# Patient Record
Sex: Female | Born: 1960 | Race: White | Hispanic: No | Marital: Married | Smoking: Never smoker
Health system: Southern US, Community
[De-identification: ages and names within clinical notes are randomized; demographics above are authoritative.]

## PROBLEM LIST (undated history)

## (undated) DIAGNOSIS — I10 Essential (primary) hypertension: Secondary | ICD-10-CM

## (undated) DIAGNOSIS — N871 Moderate cervical dysplasia: Secondary | ICD-10-CM

## (undated) DIAGNOSIS — E7800 Pure hypercholesterolemia, unspecified: Secondary | ICD-10-CM

## (undated) DIAGNOSIS — M810 Age-related osteoporosis without current pathological fracture: Secondary | ICD-10-CM

## (undated) DIAGNOSIS — N890 Mild vaginal dysplasia: Secondary | ICD-10-CM

## (undated) DIAGNOSIS — A64 Unspecified sexually transmitted disease: Secondary | ICD-10-CM

## (undated) HISTORY — DX: Pure hypercholesterolemia, unspecified: E78.00

## (undated) HISTORY — DX: Moderate cervical dysplasia: N87.1

## (undated) HISTORY — DX: Essential (primary) hypertension: I10

## (undated) HISTORY — DX: Age-related osteoporosis without current pathological fracture: M81.0

## (undated) HISTORY — DX: Unspecified sexually transmitted disease: A64

## (undated) HISTORY — PX: OTHER SURGICAL HISTORY: SHX169

## (undated) HISTORY — DX: Mild vaginal dysplasia: N89.0

---

## 2000-01-20 ENCOUNTER — Encounter: Admission: RE | Admit: 2000-01-20 | Discharge: 2000-01-20 | Payer: Self-pay | Admitting: *Deleted

## 2000-01-20 ENCOUNTER — Encounter: Payer: Self-pay | Admitting: *Deleted

## 2001-02-10 ENCOUNTER — Encounter: Payer: Self-pay | Admitting: *Deleted

## 2001-02-10 ENCOUNTER — Encounter: Admission: RE | Admit: 2001-02-10 | Discharge: 2001-02-10 | Payer: Self-pay | Admitting: *Deleted

## 2001-03-03 ENCOUNTER — Other Ambulatory Visit: Admission: RE | Admit: 2001-03-03 | Discharge: 2001-03-03 | Payer: Self-pay | Admitting: *Deleted

## 2001-06-17 ENCOUNTER — Encounter: Payer: Self-pay | Admitting: *Deleted

## 2001-06-17 ENCOUNTER — Encounter: Admission: RE | Admit: 2001-06-17 | Discharge: 2001-06-17 | Payer: Self-pay | Admitting: *Deleted

## 2002-02-17 ENCOUNTER — Encounter: Admission: RE | Admit: 2002-02-17 | Discharge: 2002-02-17 | Payer: Self-pay | Admitting: *Deleted

## 2002-02-17 ENCOUNTER — Encounter: Payer: Self-pay | Admitting: *Deleted

## 2002-03-21 ENCOUNTER — Other Ambulatory Visit: Admission: RE | Admit: 2002-03-21 | Discharge: 2002-03-21 | Payer: Self-pay | Admitting: *Deleted

## 2004-06-04 ENCOUNTER — Other Ambulatory Visit: Admission: RE | Admit: 2004-06-04 | Discharge: 2004-06-04 | Payer: Self-pay | Admitting: Obstetrics and Gynecology

## 2004-06-06 ENCOUNTER — Encounter: Admission: RE | Admit: 2004-06-06 | Discharge: 2004-06-06 | Payer: Self-pay | Admitting: Obstetrics and Gynecology

## 2004-07-13 DIAGNOSIS — N871 Moderate cervical dysplasia: Secondary | ICD-10-CM

## 2004-07-13 HISTORY — DX: Moderate cervical dysplasia: N87.1

## 2004-08-02 HISTORY — PX: CERVICAL BIOPSY  W/ LOOP ELECTRODE EXCISION: SUR135

## 2004-11-29 ENCOUNTER — Other Ambulatory Visit: Admission: RE | Admit: 2004-11-29 | Discharge: 2004-11-29 | Payer: Self-pay | Admitting: Obstetrics and Gynecology

## 2005-03-07 ENCOUNTER — Other Ambulatory Visit: Admission: RE | Admit: 2005-03-07 | Discharge: 2005-03-07 | Payer: Self-pay | Admitting: Obstetrics and Gynecology

## 2005-08-15 ENCOUNTER — Other Ambulatory Visit: Admission: RE | Admit: 2005-08-15 | Discharge: 2005-08-15 | Payer: Self-pay | Admitting: Obstetrics and Gynecology

## 2005-09-12 ENCOUNTER — Encounter: Admission: RE | Admit: 2005-09-12 | Discharge: 2005-09-12 | Payer: Self-pay | Admitting: Obstetrics and Gynecology

## 2006-12-07 ENCOUNTER — Encounter: Admission: RE | Admit: 2006-12-07 | Discharge: 2006-12-07 | Payer: Self-pay | Admitting: Obstetrics and Gynecology

## 2006-12-07 ENCOUNTER — Other Ambulatory Visit: Admission: RE | Admit: 2006-12-07 | Discharge: 2006-12-07 | Payer: Self-pay | Admitting: Obstetrics and Gynecology

## 2007-12-02 ENCOUNTER — Encounter: Admission: RE | Admit: 2007-12-02 | Discharge: 2007-12-02 | Payer: Self-pay | Admitting: Obstetrics and Gynecology

## 2007-12-29 ENCOUNTER — Encounter: Admission: RE | Admit: 2007-12-29 | Discharge: 2007-12-29 | Payer: Self-pay | Admitting: Obstetrics and Gynecology

## 2008-04-05 ENCOUNTER — Other Ambulatory Visit: Admission: RE | Admit: 2008-04-05 | Discharge: 2008-04-05 | Payer: Self-pay | Admitting: Obstetrics and Gynecology

## 2008-05-23 ENCOUNTER — Encounter: Payer: Self-pay | Admitting: Obstetrics and Gynecology

## 2008-05-23 ENCOUNTER — Ambulatory Visit (HOSPITAL_COMMUNITY): Admission: RE | Admit: 2008-05-23 | Discharge: 2008-05-24 | Payer: Self-pay | Admitting: Obstetrics and Gynecology

## 2008-05-23 HISTORY — PX: ABDOMINAL HYSTERECTOMY: SHX81

## 2008-07-15 ENCOUNTER — Ambulatory Visit (HOSPITAL_COMMUNITY): Admission: EM | Admit: 2008-07-15 | Discharge: 2008-07-16 | Payer: Self-pay | Admitting: Emergency Medicine

## 2008-07-15 ENCOUNTER — Encounter (INDEPENDENT_AMBULATORY_CARE_PROVIDER_SITE_OTHER): Payer: Self-pay | Admitting: Surgery

## 2008-07-15 HISTORY — PX: APPENDECTOMY: SHX54

## 2009-01-17 ENCOUNTER — Encounter: Admission: RE | Admit: 2009-01-17 | Discharge: 2009-01-17 | Payer: Self-pay | Admitting: Obstetrics and Gynecology

## 2009-06-28 ENCOUNTER — Encounter: Admission: RE | Admit: 2009-06-28 | Discharge: 2009-06-28 | Payer: Self-pay | Admitting: Family Medicine

## 2009-10-13 DIAGNOSIS — N890 Mild vaginal dysplasia: Secondary | ICD-10-CM

## 2009-10-13 HISTORY — DX: Mild vaginal dysplasia: N89.0

## 2010-04-03 ENCOUNTER — Encounter: Admission: RE | Admit: 2010-04-03 | Discharge: 2010-04-03 | Payer: Self-pay | Admitting: Obstetrics and Gynecology

## 2011-02-25 NOTE — Op Note (Signed)
Monique Brown, Monique Brown               ACCOUNT NO.:  [id number]   MEDICAL RECORD NO.:  [id number]          PATIENT TYPE:  OIB   LOCATION:  9316                          FACILITY:  [facility]   PHYSICIAN:  Cynthia P. Romine, M.D.DATE OF BIRTH:  1960/12/20   DATE OF PROCEDURE:  05/23/2008  DATE OF DISCHARGE:  05/24/2008                               OPERATIVE REPORT   PREOPERATIVE DIAGNOSES:  1. Uterovaginal prolapse.  2. Cystocele.   POSTOPERATIVE DIAGNOSES:  1. Uterovaginal prolapse.  2. Cystocele.   PATHOLOGY:  Pending.   PROCEDURES:  1. Total vaginal hysterectomy by Dr. Purvis Sheffield.  2. Cystocele repair with graft and cystoscopy by Dr. Alfredo Martinez.   ASSISTANT FOR THE HYSTERECTOMY:  Dr. Lorin Picket MacDiarmid   Dr. Sherron Monday will dictate his part of the surgery in a separate  report.   ANESTHESIA:  General endotracheal.   ESTIMATED BLOOD LOSS:  250 mL.   There were no complications.   PROCEDURE:  The patient was taken to the operating room and after  induction of adequate general anesthesia was placed in dorsal lithotomy  position and prepped and draped in the usual fashion.  The bladder was  drained with a red rubber catheter.  There was a grade 3 cystocele.  A  posterior weighted and anterior Sims retractor were placed and the  anterior lip was grabbed with a Jacobs tenaculum.  Vaginal mucosa over  the cervix was injected with a dilute solution of Xylocaine with  epinephrine.  The mucosa was incised with a knife from 9 o'clock to 3  o'clock and pushed forward with sharp and blunt dissection.  The  tenaculums was then used to grasp the posterior lip.  A posterior  colpotomy incision was made and a long Veenema retractor was placed.  The uterosacral ligaments were clamped, cut, and tied bilaterally with 0  Vicryl.  The hysterectomy continued up the cardinal ligament clamping,  cutting, and tying in sequence.  The uterine arteries were identified,  clamped, cut, and doubly  tied.  The hysterectomy continued up the  clamping, cutting, and tying in sequence as the pedicle containing the  tube, utero-ovarian ligament, and round ligament that pedicle was  clamped, cut, and doubly tied.  The specimen was removed and sent to  pathology.  The pedicles were inspected and found to be free of  bleeding.  The vaginal cuff was run in a  running locking suture from 2 o'clock to 10 o'clock using 0 Vicryl.  The  wound was irrigated and was found to be hemostatic.  The vaginal cuff  was not closed in preparation for Dr. Sherron Monday to do his cystocele  repair and he will dictate that surgery separately.      Cynthia P. Romine, M.D.  Electronically Signed     CPR/MEDQ  D:  06/12/2008  T:  06/13/2008  Job:  [id number]   cc:   Martina Sinner, MD  Fax: [phone]

## 2011-02-25 NOTE — Op Note (Signed)
NAMENIANI, MOURER NO.:  [id number]   MEDICAL RECORD NO.:  [id number]          PATIENT TYPE:  OIB   LOCATION:  9316                          FACILITY:  [facility]   PHYSICIAN:  Martina Sinner, MD DATE OF BIRTH:  Jun 10, 1961   DATE OF PROCEDURE:  05/23/2008  DATE OF DISCHARGE:                               OPERATIVE REPORT   PREOPERATIVE DIAGNOSES:  Uterovaginal prolapse plus cystocele.   POSTOPERATIVE DIAGNOSES:  Uterovaginal prolapse plus cystocele.   SURGERY:  Cystocele repair plus graft plus cystoscopy.   SURGEON:  Martina Sinner, MD   ASSISTANT:  Edwena Felty. Romine, MD   Transvaginal hysterectomy   SURGEON:  Cynthia P. Romine, MD   ASSISTANT:  Lum Keas, MD   Ms. Darrel Hoover had pelvic organ prolapse with a small grade 3  cystocele,loss of cervical support,and a very mild asymptomatic  rectocele.  She consented to the above surgery.  The patient was prepped  and draped in usual fashion.  Preoperative laboratory tests were normal.  Extra care was taken with leg positioning to minimize the risk of  compartment syndrome, neuropathy, and DVT.  Transvaginal hysterectomy  was performed by Dr. Tresa Res and I was asked to join following this.   Following the hysterectomy, the vaginal cuff had been one posteriorly  and a McCall procedure was done to plicate the uterosacral ligaments.  This maneuver lessened the rectocele and significantly less than the  size of the cystocele.  There is no question, the vaginal cuff was  actually reasonably well supported with minimal descensus and she did  not need a sacrospinous fixation, but I elected to four-corner dermal  graft and cystocele repair.   I placed 2 Allis clamps anteriorly at the open peritoneal cavity.  I  made a long anterior incision after instilling 20 mL of lidocaine  epinephrine mixture for hemostasis and for dissection.  I sharply split  in the midline all the way to the proximal urethra.   I sharply dissected  laterally and then finger dissected off almost to the white line  bilaterally.  I appropriately mobilized cephalad at the cuff opening.   Without plicating the urethra, I did a two-layer anterior repair with 2-  0 Vicryl.  I cystoscoped the patient after giving indigo carmine.  There  was a typical camel hump deformity in the midline and ureteral orifices  were both situated laterally and effluxing good jets of blue dye.  There  was no injury to the urethra and bladder.   I used my narrow malleable in usual technique to make certain that I was  beyond the peritoneal wall reflection and all the way to the white line.  I placed 4-0 Vicryls on a CT-2 needle through the ileal coccygeus  muscle.  The 2 cephalad were in a cephalad direction along the white  line towards, but not all the way to the ischial spine.  The other 2  were close to the urethrovesical angle.  They were firmly placed.   I soaked 10, 7 x 4 dermal graft was tailored to fit appropriately.  It  was sutured in place in a four-corner fashion.  It laid nicely not under  tension and there was redundant dermis at the level of the apex.  I  trimmed some in the midline not to support the urethra.   I trimmed minimally vaginal wall mucosa approximately 1 cm on the right  and a few millimeters on the left.  I did a running anterior vaginal  wall closure with running 2-0 Vicryl on a CT-1 needle.  Dr. Tresa Res then  closed the vaginal cuff.   Both Dr. Tresa Res and I were very happy with the vaginal length and  support.  Vaginal pack with Estrace cream was inserted.  Estimated blood  loss was approximately 250 mL.  Leg position was good at the end of the  case.  Catheter was draining blue urine at the end of the case.  The  patient was taken to recovery room.  I am hoping this operation will  reach Ms. Robson's treatment goal.  There is no question she did not  need a rectocele repair performed  simultaneously.           ______________________________  Martina Sinner, MD  Electronically Signed     SAM/MEDQ  D:  05/23/2008  T:  05/24/2008  Job:  [id number]

## 2011-02-25 NOTE — H&P (Signed)
[name], [name] NO.:  [id number]   MEDICAL RECORD NO.:  [id number]          PATIENT TYPE:  INP   LOCATION:  1318                         FACILITY:  [facility]   PHYSICIAN:  Ardeth Sportsman, MD     DATE OF BIRTH:  [birth year]   DATE OF ADMISSION:  07/15/2008  DATE OF DISCHARGE:  07/16/2008                              HISTORY & PHYSICAL   PRIMARY CARE PHYSICIAN:  Cynthia P. Romine, M.D.   UROLOGIST:  Alfredo Martinez, M.D.   REASON FOR CONSULT AND ADMISSION:  Probable acute appendicitis.   HISTORY OF PRESENT ILLNESS:  Monique Brown is a 50 year old female  otherwise very healthy who is about almost 2 months out from repair of a  cystocele and a hysterectomy for prolapsed uterus.  She had been  recovering well from that and was actually planning to get married next  week, in here today with her fiance.   She has had worsening abdominal pain over the past week that is focused  in her right lower quadrant.  It was felt initially to be a cyst, as it  did not seem to be too intense.  However, after having a heavy meal  tonight, the pain intensified and became very severe in the right lower  quadrant.  She required numerous rounds of IV medication to help get it  under control.  She has had some anorexia, but no severe nausea or  vomiting.  She has never had anything like this before this past week.  No hematochezia or melena.  No sick contacts or travel history.  No  dysphagia.  No heartburn or reflux.  She normally has excellent physical  activity.   Workup was concerning for appendicitis and surgical consult was  requested.   PAST MEDICAL HISTORY:  1. Ureterovaginal prolapse, status post hysterectomy.  2. Cystocele status post repair and cystoscopy by Dr. Sherron Monday.   PAST SURGICAL HISTORY:  1. Transvaginal history by Dr. Meredeth Ide.  2. Cystocele status post graft cholecystectomy by Dr. Sherron Monday.      These were on May 23, 2008.   ALLERGIES:   None.   MEDICATIONS:  None.   SOCIAL HISTORY:  She occasionally drinks wine, but no other alcohol or  drug use.  She is here with her fiance.   FAMILY HISTORY:  Negative for any cardiopulmonary disease.  She does  have 2 brothers with type 1 diabetes.  No history of inflammatory bowel  disorders or GI problems, or hepatobiliary, other issues, or Crohn's  disease that she is aware of.   REVIEW OF SYSTEMS:  As noted in the chart.  CONSTITUTIONAL:  No major  fevers, chills, sweats.  She does have some fatigue, not severe.  Eyes,  ENT, cardiac, respiratory, heme, lymph, allergic, otherwise negative.  Musculoskeletal, psych, neurologic, otherwise negative.  GI:  As noted  above.  GYN:  Recent hysterectomy, but no vaginal bleeding or discharge.  GU:  She does have a little bit of pressure when she tries to urinate,  but I would not say frequency or urgency.  No dysuria, pyuria, or  hematuria.  No recent urinary tract infection that she can recall.  Hepatic, renal, endocrine otherwise negative.   PHYSICAL EXAM:  VITAL SIGNS:  Temperature 98.4, blood pressure 155/97,  pulse 89, respirations 20.  Her initial pain was 10/10 and went down to  7/10 with pain meds, currently it is around 3/10 to 4/10 after receiving  IV narcotics.  She has 97% sats on room air.  GENERAL:  She is a well-developed, well-nourished thin female lying in  bed, very still, not particularly toxic.  PSYCH:  She is pleasant, interactive, with average to above average  intelligence.  No evidence of any dementia, delirium, psychosis, or  paranoia.  NEUROLOGIC:  Cranial nerves 2-12 are intact.  Fingers 5/5, strength  symmetrical.  No resting or attention tremors.  EYES:  Pupils equal, round, and reactive to light.  Extraocular  movements intact.  NECK:  Supple.  No mass.  Trachea is midline.  ENT:  She is normocephalic.  Mucous membranes are dry.  Oropharynx  clear.  HEART:  Regular rate and rhythm.  No murmurs, rubs, or  gallops.  CHEST:  Clear to auscultation bilaterally.  No wheeze, rales, or  rhonchi.  No pain to rib compression.  LYMPHATICS:  No head, neck, axillary, or groin lymphadenopathy.  ABDOMEN:  Soft and flat.  She does have pain right over McBurney's point  that is reproduced with a cough and bed shake, although it is not too  intense.  She did receive narcotics in the ED.  GENITOURINARY:  Normal external female genitalia.  RECTAL:  Deferred.  EXTREMITIES:  No cyanosis, clubbing, or edema.  MUSCULOSKELETAL:  Full range of motion of the shoulders, elbows, wrists,  as well as hips, knees, and ankles.  SKIN:  No petechia.  No purpura.  No other obvious sores or lesions.   LABORATORY VALUES:  Her white count is 8.4 with a borderline left shift.  Urinalysis has trace leukocyte esterase, otherwise negative.  Her  electrolytes are within the normal range.  She has a CAT scan which  shows no evidence of any free air or bowel obstruction, or significant  free fluid.  She does have blind filled loop and enlarged appendix going  down her pelvis.   ASSESSMENT AND PLAN:  A 50 year old female with story concerning for  acute appendicitis and seems to be confirmed by physical exam and CAT  scan.   The anatomy and physiology of the digestive tract was discussed.  Differential diagnosis was discussed.  Options discussed and  recommendation was made for diagnostic laparoscopy with appendectomy.  The risks, benefits, and alternatives were discussed.  Questions  answered and she agrees to proceed.  Will work to coordinate this at a  continent time.      Ardeth Sportsman, MD  Electronically Signed     SCG/MEDQ  D:  07/15/2008  T:  07/16/2008  Job:  [id number]

## 2011-02-25 NOTE — Op Note (Signed)
[name], [name] NO.:  [id number]   MEDICAL RECORD NO.:  [id number]          PATIENT TYPE:  INP   LOCATION:  1318                         FACILITY:  [facility]   PHYSICIAN:  Monique Sportsman, MD     DATE OF BIRTH:  May 27, 1961   DATE OF PROCEDURE:  DATE OF DISCHARGE:                               OPERATIVE REPORT   PRIMARY CARE PHYSICIAN:  Monique Brown, M.D. with OB-GYN.   UROLOGIST:  Monique Sinner, MD   SURGEON:  Monique Sportsman, MD   ASSISTANT:  None.   PREOPERATIVE DIAGNOSIS:  Acute appendicitis.   POSTOPERATIVE DIAGNOSIS:  1. Acute appendicitis, suppurative - probable early perforated.  2. Question of small right direct inguinal hernia.   PROCEDURE PERFORMED:  Diagnostic laparoscopy with appendectomy.   ANESTHESIA:  1. General anesthesia.  2. Local anesthetic in a field block around all port sites.   SPECIMEN:  Appendix.   DRAINS:  None.   ESTIMATED BLOOD LOSS:  Less than 10 mL.   COMPLICATIONS:  None apparent.   INDICATIONS:  [name] is a pleasant 50 year old who had a total  vaginal hysterectomy for uterine prolapse and cystocele repair by Dr.  Lorin Picket A Brown back in August 2009.  She has had worsening abdominal  pain for the past week.  It was initially felt to possibly be an ovarian  cyst, however, she had severe pain after eating a heavier meal and came  to the emergency room.  Workup by CAT scan and physical exam was more  concerning for acute appendicitis, and therefore, a surgical  consultation was requested.   The anatomy of physiology of the digestive tract was explained.  Past  physiology of acute appendicitis with its natural history risks were  discussed.  Options discussed and recommendations made for diagnostic  laparoscopy with probable appendectomy.  The risks, benefits and  alternatives were discussed in detail with the patient and her fiance.  Questions answers and she and her future husband agreed to  proceed.   OPERATIVE FINDINGS:  She had a short appendix, but the distal half was  densely adherent to the pelvic sidewall and her right adnexa,  particularly her fallopian tube.  There was evidence of suppurative and  chronic phlegmon, but there was no large abscess.  It was suspicious for  localized perforation.  The rest of her abdomen appeared unremarkable.  She only has some mild adhesions with some sigmoid epiploica appendages  down to her pelvis, but otherwise no significant adhesions.  She may  have a small direct inguinal hernia.   DESCRIPTION OF PROCEDURE:  Informed consent was confirmed.  The patient  was already given cefoxitin in the emergency room, so none was repeated  since it was less than 8-hours since her prior dose.  She had sequential  compression devices throughout the entire case.  She was positioned  supine with the arms tucked.  She underwent general anesthesia without  any difficulty.  A Foley catheter was sterilely placed.  Her abdomen was  prepped and draped in a sterile fashion.   A 5-mm  port was placed infraumbilically using Hassan technique to good  result.  Capnoperitoneum to 15mm provided  good intra-abdominal  insufflation.  Under direct visualization, a 5-mm initial port was  placed in the left flank and 12-mm port was placed suprapubically  directed away from the bladder more cephalad.   Camera inspection revealed findings as noted above.  There was some  moderate omental adhesions to the right lower quadrant, and these were  carefully freed off, mostly with blunt dissection with some focused  ultrasonic dissection.  The small bowel was easily brought out of the  pelvis.  I was able to mobilize the cecum in a lateral medial fashion,  finding the appendix densely adhered to the right pelvic sidewall.  I  was able to carefully free it off the fallopian tubes and pelvic  sidewall using primary blunt dissection, taking care to avoid any  grasping or  injury to the right adnexal structures.  There was some  inflammation, but they were not bathed in pus or feces.   The appendiceal mesentery was able to be isolated and ligated using  ultrasonic dissection until we got to the appendiceal/cecal junction.  The appendix was transected at this location using a laparoscopic  stapler and removed out the subxiphoid port inside a specimen bag.   Copious irrigation was done over 4 L of saline to remove any small  phlegmon and peeled.  Irrigation was done out in the pelvis and the  right liver.  There was some epiploica appendage attachments that would  have created some opening for internal herniation and bowel obstruction,  so I freed those off carefully.  I did not go near the bladder or mess  with the bladder.   Interestingly, camera inspection, it looked like she may have a small  right direct inguinal defect, although it was a physiological variant.  There was no evidence of any indirect defects.   Capnoperitoneum was evacuated.  Inspection was done and there was no  evidence of any bowel injury.  The staple line was intact with no  bleeding.  Hemostasis was excellent.  There was no evidence of any bowel  injury or damage.  Capnoperitoneum was evacuated.  Ports were removed.  The suprapubic fascial defect was closed using a 0 Vicryl  interrupted stitch x2.  The skin was closed using 4-0 Monocryl stitch.  Sterile dressings applied.  The patient was extubated and sent to the  recovery room in stable condition.   I discussed postoperative care with the patient and her fiance just  prior to surgery and will discuss after surgery.      Monique Sportsman, MD  Electronically Signed     SCG/MEDQ  D:  07/15/2008  T:  07/15/2008  Job:  [id number]   cc:   Monique Brown, M.D.  Fax: [phone]   Monique Brown, M.D.

## 2011-02-28 NOTE — Discharge Summary (Signed)
Monique Brown, Monique Brown               ACCOUNT NO.:  [id number]   MEDICAL RECORD NO.:  [id number]          PATIENT TYPE:  OIB   LOCATION:  9316                          FACILITY:  [facility]   PHYSICIAN:  Cynthia P. Romine, M.D.DATE OF BIRTH:  10-26-1960   DATE OF ADMISSION:  05/23/2008  DATE OF DISCHARGE:  05/24/2008                               DISCHARGE SUMMARY   DISCHARGE DIAGNOSES:  Pelvic pain and pressure with uterovaginal  prolapse and cystocele.   HOSPITAL COURSE:  This is a 50 year old divorced white female, gravida  3, para 3 with uterovaginal prolapse and cystocele, grade 3.  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  Her uterus was 7 x 6 x 3 cm on ultrasound and had a 4-cm fibroid and the  patient was admitted to undergo total vaginal hysterectomy by me and a  cystocele repair by Dr. Alfredo Martinez.  On May 23, 2008, the  patient did have an uncomplicated total vaginal hysterectomy and  cystocele repair.  She had a cystocele repair with a graft and a  cystoscopy.  Estimated blood loss was 250 mL and there were no  complications.  Postoperatively, the patient had an uneventful course.  By the following morning, she was afebrile, in good condition, and felt  to be in satisfactory condition for discharge.  She was given discharge  instructions, a prescription for Percocet 5 mg #20 to take 1-2 p.o. q.  4h. p.r.n. pain.  The patient was able to void prior to discharge.  She  was given discharge instructions for follow up with me and with Dr.  Sherron Monday.  Labs showed admission H&H with 14 and 40, on discharge 10.9  and 31.6.  Blood type was A positive.  Serum pregnancy test was  negative.  PT and PTT were normal.      Cynthia P. Romine, M.D.  Electronically Signed     CPR/MEDQ  D:  06/05/2008  T:  06/06/2008  Job:  [id number]   cc:   Martina Sinner, MD  Fax: [phone]

## 2011-05-07 ENCOUNTER — Other Ambulatory Visit: Payer: Self-pay | Admitting: Obstetrics and Gynecology

## 2011-05-07 DIAGNOSIS — Z1231 Encounter for screening mammogram for malignant neoplasm of breast: Secondary | ICD-10-CM

## 2011-05-16 ENCOUNTER — Ambulatory Visit
Admission: RE | Admit: 2011-05-16 | Discharge: 2011-05-16 | Disposition: A | Discharge status: Home / Self Care | Payer: 59 | Source: Ambulatory Visit | Attending: Obstetrics and Gynecology | Admitting: Obstetrics and Gynecology

## 2011-05-16 ENCOUNTER — Ambulatory Visit: Payer: Self-pay

## 2011-05-16 DIAGNOSIS — Z1231 Encounter for screening mammogram for malignant neoplasm of breast: Secondary | ICD-10-CM

## 2011-07-11 LAB — CBC
HCT: 40.9
Platelets: 173
Platelets: 238
RBC: 3.31 — ABNORMAL LOW
RBC: 4.34
RDW: 12.4
WBC: 6

## 2011-07-11 LAB — PROTIME-INR: INR: 1

## 2011-07-11 LAB — HCG, SERUM, QUALITATIVE: Preg, Serum: NEGATIVE

## 2011-07-11 LAB — APTT: aPTT: 28

## 2011-07-11 LAB — TYPE AND SCREEN
ABO/RH(D): A POS
Antibody Screen: NEGATIVE

## 2011-07-15 LAB — CBC
Hemoglobin: 14
MCHC: 33.8
MCV: 94.6
Platelets: 245
WBC: 8.4

## 2011-07-15 LAB — URINALYSIS, ROUTINE W REFLEX MICROSCOPIC
Glucose, UA: NEGATIVE
Hgb urine dipstick: NEGATIVE
Ketones, ur: NEGATIVE
Nitrite: NEGATIVE
Protein, ur: NEGATIVE
Urobilinogen, UA: 0.2

## 2011-07-15 LAB — POCT I-STAT, CHEM 8
BUN: 10
Calcium, Ion: 1.24
Chloride: 102
Creatinine, Ser: 1
Potassium: 3.8
Sodium: 139

## 2011-07-15 LAB — DIFFERENTIAL
Basophils Absolute: 0
Basophils Relative: 0
Eosinophils Relative: 1
Lymphs Abs: 0.9
Monocytes Relative: 2 — ABNORMAL LOW
Neutro Abs: 7.3
Neutrophils Relative %: 86 — ABNORMAL HIGH

## 2011-07-15 LAB — URINE MICROSCOPIC-ADD ON

## 2011-09-19 ENCOUNTER — Other Ambulatory Visit: Payer: Self-pay | Admitting: Family Medicine

## 2011-09-19 DIAGNOSIS — Z780 Asymptomatic menopausal state: Secondary | ICD-10-CM

## 2011-10-03 ENCOUNTER — Ambulatory Visit
Admission: RE | Admit: 2011-10-03 | Discharge: 2011-10-03 | Disposition: A | Discharge status: Home / Self Care | Payer: 59 | Source: Ambulatory Visit | Attending: Family Medicine | Admitting: Family Medicine

## 2011-10-03 DIAGNOSIS — Z780 Asymptomatic menopausal state: Secondary | ICD-10-CM

## 2011-10-03 LAB — HM DEXA SCAN

## 2013-01-17 ENCOUNTER — Other Ambulatory Visit: Payer: Self-pay

## 2013-01-17 DIAGNOSIS — Z1231 Encounter for screening mammogram for malignant neoplasm of breast: Secondary | ICD-10-CM

## 2013-02-14 ENCOUNTER — Ambulatory Visit
Admission: RE | Admit: 2013-02-14 | Discharge: 2013-02-14 | Disposition: A | Discharge status: Home / Self Care | Payer: 59 | Source: Ambulatory Visit

## 2013-02-14 DIAGNOSIS — Z1231 Encounter for screening mammogram for malignant neoplasm of breast: Secondary | ICD-10-CM

## 2013-07-29 ENCOUNTER — Ambulatory Visit: Payer: Self-pay | Admitting: Obstetrics and Gynecology

## 2013-08-15 ENCOUNTER — Ambulatory Visit (INDEPENDENT_AMBULATORY_CARE_PROVIDER_SITE_OTHER): Payer: 59 | Admitting: Obstetrics and Gynecology

## 2013-08-15 ENCOUNTER — Encounter: Payer: Self-pay | Admitting: Obstetrics and Gynecology

## 2013-08-15 VITALS — BP 110/64 | HR 56 | Resp 16 | Ht 66.0 in | Wt 132.0 lb

## 2013-08-15 DIAGNOSIS — E559 Vitamin D deficiency, unspecified: Secondary | ICD-10-CM

## 2013-08-15 DIAGNOSIS — Z0000 Encounter for general adult medical examination without abnormal findings: Secondary | ICD-10-CM

## 2013-08-15 DIAGNOSIS — Z01419 Encounter for gynecological examination (general) (routine) without abnormal findings: Secondary | ICD-10-CM

## 2013-08-15 LAB — POCT URINALYSIS DIPSTICK
Bilirubin, UA: NEGATIVE
Blood, UA: NEGATIVE
Glucose, UA: NEGATIVE
Ketones, UA: NEGATIVE
Leukocytes, UA: NEGATIVE
Nitrite, UA: NEGATIVE
Protein, UA: NEGATIVE
Urobilinogen, UA: NEGATIVE
pH, UA: 5

## 2013-08-15 LAB — COMPREHENSIVE METABOLIC PANEL
ALT: 22 U/L (ref 0–35)
AST: 27 U/L (ref 0–37)
Albumin: 4.9 g/dL (ref 3.5–5.2)
Alkaline Phosphatase: 52 U/L (ref 39–117)
CO2: 29 mEq/L (ref 19–32)
Creat: 0.78 mg/dL (ref 0.50–1.10)
Glucose, Bld: 65 mg/dL — ABNORMAL LOW (ref 70–99)
Sodium: 137 mEq/L (ref 135–145)
Total Bilirubin: 0.7 mg/dL (ref 0.3–1.2)
Total Protein: 7.3 g/dL (ref 6.0–8.3)

## 2013-08-15 LAB — CBC
HCT: 42.2 % (ref 36.0–46.0)
Hemoglobin: 14.4 g/dL (ref 12.0–15.0)
MCH: 31.6 pg (ref 26.0–34.0)
MCHC: 34.1 g/dL (ref 30.0–36.0)
MCV: 92.5 fL (ref 78.0–100.0)
Platelets: 280 K/uL (ref 150–400)
RBC: 4.56 MIL/uL (ref 3.87–5.11)
RDW: 12.8 % (ref 11.5–15.5)
WBC: 4.7 K/uL (ref 4.0–10.5)

## 2013-08-15 LAB — LIPID PANEL
Cholesterol: 284 mg/dL — ABNORMAL HIGH (ref 0–200)
HDL: 90 mg/dL
LDL Cholesterol: 173 mg/dL — ABNORMAL HIGH (ref 0–99)
Total CHOL/HDL Ratio: 3.2 ratio
Triglycerides: 103 mg/dL
VLDL: 21 mg/dL (ref 0–40)

## 2013-08-15 NOTE — Patient Instructions (Signed)

## 2013-08-15 NOTE — Addendum Note (Signed)
Addended by: Alphonsa Overall on: 08/15/2013 10:16 AM   Modules accepted: Orders

## 2013-08-15 NOTE — Progress Notes (Signed)
Patient ID: Monique Brown, female   DOB: August 21, 1961, 52 y.o.   MRN: [id number] GYNECOLOGY VISIT  PCP:   Laurann Montana, MD  Referring provider:   HPI: 52 y.o.   Divorced  Caucasian  female   [phone] with No LMP recorded. Patient has had a hysterectomy.   here for  AEX.  No complaints.  No urinary control problems.  Complete review of systems negative.   History of CIN II in 2005.  Hgb:    Urine:  Neg  GYNECOLOGIC HISTORY: No LMP recorded. Patient has had a hysterectomy. Sexually active:  yes Partner preference: female Contraception: hysterectomy for prolapse and fibroid.   Menopausal hormone therapy: no DES exposure:  no  Blood transfusions:   no Sexually transmitted diseases:  HPV  GYN Procedures:  TVH, cystocele reair w/graft and cystoscopy 05/2008, LEEP 2005 Mammogram:    02-14-13 wnl:[facility]             Pap:   07-28-12 wnl History of abnormal pap smear:  Hx CIN II with LEEP 2005   OB History   Grav Para Term Preterm Abortions TAB SAB Ect Mult Living   3 3 3       3        LIFESTYLE: Exercise:     walking          Tobacco:     no Alcohol:        5 glasses of wine per week Drug use:     no  OTHER HEALTH MAINTENANCE: Tetanus/TDap:   Unsure: probably up to date with work Gardisil:  NA Influenza:     never Zostavax:  NA  Bone density:   09-19-11 :[facility]: osteopenia - Dr. Laurann Montana. Colonoscopy:   2010 JYN:[facility].  Next colonoscopy due 2020  Cholesterol check:  Borderline 1 year ago   Family History  Problem Relation Age of Onset  . Hypertension Mother   . Stroke Father   . Hypertension Father   . Diabetes Brother   . Diabetes Brother     There are no active problems to display for this patient.  Past Medical History  Diagnosis Date  . 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 ectocervix margin  . VAIN I (vaginal intraepithelial neoplasia grade I) 2011  . STD (sexually transmitted disease)     HPV     Past Surgical History  Procedure Laterality Date  . Appendectomy  07-15-08  . Abdominal hysterectomy  05-23-08    TVH, cystocele repair w/graft & cystoscopy.  Ovaries remain  . Vain i    . Cervical biopsy  w/ loop electrode excision  08-02-2004    CIN II, Pos. ectocervix margin    ALLERGIES: Review of patient's allergies indicates no known allergies.  Current Outpatient Prescriptions  Medication Sig Dispense Refill  . cholecalciferol (VITAMIN D) 1000 UNITS tablet Take 1,000 Units by mouth as needed.       No current facility-administered medications for this visit.     ROS:  Pertinent items are noted in HPI.  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   PHYSICAL EXAMINATION:    BP 110/64  Pulse 56  Resp 16  Ht 5\' 6"  (1.676 m)  Wt 132 lb (59.875 kg)  BMI 21.32 kg/m2   Wt Readings from Last 3 Encounters:  08/15/13 132 lb (59.875 kg)     Ht Readings from Last 3 Encounters:  08/15/13 5\' 6"  (  1.676 m)    General appearance: alert, cooperative and appears stated age Head: Normocephalic, without obvious abnormality, atraumatic Neck: no adenopathy, supple, symmetrical, trachea midline and thyroid not enlarged, symmetric, no tenderness/mass/nodules Lungs: clear to auscultation bilaterally Breasts: Inspection negative, No nipple retraction or dimpling, No nipple discharge or bleeding, No axillary or supraclavicular adenopathy, Normal to palpation without dominant masses Heart: regular rate and rhythm Abdomen: soft, non-tender; no masses,  no organomegaly Extremities: extremities normal, atraumatic, no cyanosis or edema Skin: Skin color, texture, turgor normal. No rashes or lesions Lymph nodes: Cervical, supraclavicular, and axillary nodes normal. No abnormal inguinal nodes palpated Neurologic: Grossly normal  Pelvic: External genitalia:  no lesions              Urethra:  normal appearing urethra with no masses, tenderness or lesions               Bartholins and Skenes: normal                 Vagina: normal appearing vagina with normal color and discharge, no lesions, graft palpable anteriorly. Minimal high cystocele.              Cervix:  Absent.              Pap and high risk HPV testing done: yes.            Bimanual Exam:  Uterus:   Absent.                                       Adnexa: normal adnexa in size, nontender and no masses                                      Rectovaginal: Confirms                                      Anus:  normal sphincter tone, no lesions  ASSESSMENT  Normal gynecologic exam. Status post hysterectomy and cystocele prolapse repair.  History of CIN II.  Ostoepenia.   PLAN  Mammogram yearly Pap smear and high risk HPV testing yearly until at least 2025.  Counseled on  Osteoporosis prevention. Bone density next year.  Dr. Cliffton Asters ordered last bone density. FLP, CBC, CMP, Vit D.  Encouraged flu vaccine.  Return annually or prn   An After Visit Summary was printed and given to the patient.

## 2013-08-16 LAB — VITAMIN D 25 HYDROXY (VIT D DEFICIENCY, FRACTURES): Vit D, 25-Hydroxy: 45 ng/mL (ref 30–89)

## 2014-06-08 ENCOUNTER — Encounter: Payer: Self-pay | Admitting: Obstetrics and Gynecology

## 2014-08-14 ENCOUNTER — Encounter: Payer: Self-pay | Admitting: Obstetrics and Gynecology

## 2014-08-21 ENCOUNTER — Encounter: Payer: Self-pay | Admitting: Obstetrics and Gynecology

## 2014-08-21 ENCOUNTER — Ambulatory Visit (INDEPENDENT_AMBULATORY_CARE_PROVIDER_SITE_OTHER): Payer: 59 | Admitting: Obstetrics and Gynecology

## 2014-08-21 ENCOUNTER — Ambulatory Visit: Payer: 59 | Admitting: Obstetrics and Gynecology

## 2014-08-21 VITALS — BP 110/68 | HR 60 | Resp 14 | Ht 65.5 in | Wt 133.0 lb

## 2014-08-21 DIAGNOSIS — Z01419 Encounter for gynecological examination (general) (routine) without abnormal findings: Secondary | ICD-10-CM

## 2014-08-21 DIAGNOSIS — Z0000 Encounter for general adult medical examination without abnormal findings: Secondary | ICD-10-CM

## 2014-08-21 LAB — COMPREHENSIVE METABOLIC PANEL
ALK PHOS: 52 U/L (ref 39–117)
ALT: 17 U/L (ref 0–35)
AST: 23 U/L (ref 0–37)
Albumin: 4.5 g/dL (ref 3.5–5.2)
BILIRUBIN TOTAL: 0.5 mg/dL (ref 0.2–1.2)
BUN: 9 mg/dL (ref 6–23)
CO2: 26 mEq/L (ref 19–32)
CREATININE: 0.75 mg/dL (ref 0.50–1.10)
Calcium: 9.7 mg/dL (ref 8.4–10.5)
Chloride: 100 mEq/L (ref 96–112)
Glucose, Bld: 82 mg/dL (ref 70–99)
Potassium: 4 mEq/L (ref 3.5–5.3)
Sodium: 139 mEq/L (ref 135–145)
Total Protein: 6.8 g/dL (ref 6.0–8.3)

## 2014-08-21 LAB — LIPID PANEL
CHOL/HDL RATIO: 2.6 ratio
CHOLESTEROL: 257 mg/dL — AB (ref 0–200)
HDL: 98 mg/dL (ref >39)
LDL CALC: 136 mg/dL — AB (ref 0–99)
TRIGLYCERIDES: 117 mg/dL (ref <150)
VLDL: 23 mg/dL (ref 0–40)

## 2014-08-21 LAB — POCT URINALYSIS DIPSTICK
BILIRUBIN UA: NEGATIVE
Glucose, UA: NEGATIVE
Ketones, UA: NEGATIVE
Leukocytes, UA: NEGATIVE
NITRITE UA: NEGATIVE
PH UA: 5
Protein, UA: NEGATIVE
RBC UA: NEGATIVE
Urobilinogen, UA: NEGATIVE

## 2014-08-21 LAB — CBC
HEMATOCRIT: 39.5 % (ref 36.0–46.0)
Hemoglobin: 13.8 g/dL (ref 12.0–15.0)
MCH: 31.8 pg (ref 26.0–34.0)
MCHC: 34.9 g/dL (ref 30.0–36.0)
MCV: 91 fL (ref 78.0–100.0)
Platelets: 277 10*3/uL (ref 150–400)
RBC: 4.34 MIL/uL (ref 3.87–5.11)
RDW: 12.8 % (ref 11.5–15.5)
WBC: 3.9 10*3/uL — ABNORMAL LOW (ref 4.0–10.5)

## 2014-08-21 NOTE — Progress Notes (Signed)
Patient ID: Monique Brown, female   DOB: 10-25-1960, 53 y.o.   MRN: [id number] 53 y.o. [id number] MarriedCaucasianF here for annual exam.   Patient is Charity fundraiserN.  Husband works for [employer]. Daughter had a baby.  Down sized their home.  Bought a home and renovated it.   PCP:  Monique Montanaynthia White, MD   No LMP recorded. Patient has had a hysterectomy.          Sexually active: Yes.   female The current method of family planning is status post hysterectomy.--still has ovaries.   Had cystocele repair and graft replacement.   Exercising: Yes.    walking. Smoker:  no  Health Maintenance: Pap:  08-15-13 wnl:neg HR HPV History of abnormal Pap:  Yes,Hx CIN II with LEEP procedure 2005 MMG:  02-15-13 heterogeneously dense/nl:[facility] Colonoscopy:  2010 [facility].  Next colonoscopy due 2020. BMD:   10-03-11: Dr. Cliffton AstersWhite.  TDaP:  Up to date through work Screening Labs - Hb today: 13.8, Urine today: Neg   reports that she has never smoked. She does not have any smokeless tobacco history on file. She reports that she drinks about 4.2 oz of alcohol per week. She reports that she does not use illicit drugs.  Past Medical History  Diagnosis Date  . 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 ectocervix margin  . VAIN I (vaginal intraepithelial neoplasia grade I) 2011  . STD (sexually transmitted disease)     HPV    Past Surgical History  Procedure Laterality Date  . Appendectomy  07-15-08  . Abdominal hysterectomy  05-23-08    TVH, cystocele repair w/graft & cystoscopy.  Ovaries remain  . Vain i    . Cervical biopsy  w/ loop electrode excision  08-02-2004    CIN II, Pos. ectocervix margin    No current outpatient prescriptions on file.   No current facility-administered medications for this visit.    Family History  Problem Relation Age of Onset  . Hypertension Mother   . Stroke Father   . Hypertension Father   . Diabetes Brother   . Diabetes Brother     ROS:   Pertinent items are noted in HPI.  Otherwise, a comprehensive ROS was negative.  Exam:   BP 110/68 mmHg  Pulse 60  Resp 14  Ht 5' 5.5" (1.664 m)  Wt 133 lb (60.328 kg)  BMI 21.79 kg/m2    Height: 5' 5.5" (166.4 cm)  Ht Readings from Last 3 Encounters:  08/21/14 5' 5.5" (1.664 m)  08/15/13 5\' 6"  (1.676 m)    General appearance: alert, cooperative and appears stated age Head: Normocephalic, without obvious abnormality, atraumatic Neck: no adenopathy, supple, symmetrical, trachea midline and thyroid normal to inspection and palpation Lungs: clear to auscultation bilaterally Breasts: normal appearance, no masses or tenderness, Inspection negative, No nipple retraction or dimpling, No nipple discharge or bleeding, No axillary or supraclavicular adenopathy Heart: regular rate and rhythm Abdomen: soft, non-tender; bowel sounds normal; no masses,  no organomegaly Extremities: extremities normal, atraumatic, no cyanosis or edema Skin: Skin color, texture, turgor normal. No rashes or lesions Lymph nodes: Cervical, supraclavicular, and axillary nodes normal. No abnormal inguinal nodes palpated Neurologic: Grossly normal   Pelvic: External genitalia:  no lesions              Urethra:  normal appearing urethra with no masses, tenderness or lesions              Bartholins and  Skenes: normal                 Vagina: normal appearing vagina with normal color and discharge, no lesions, graft palpable anteriorly.               Cervix: absent              Pap taken: Yes.   Bimanual Exam:  Uterus:  uterus absent              Adnexa: no mass, fullness, tenderness               Rectovaginal: Confirms               Anus:  normal sphincter tone, no lesions  A:  Well Woman with normal exam Hx CIN II and VAIN I.  Hx Vit D deficiency.   P:   Mammogram yearly.  pap smear and HR HPV testing.  Routine labs - cholesterol, Vit D, CMP, CBC.  Increase exercise, Calcium and Vit D in diet. Bone density  report from Dr. Cliffton AstersWhite.  Will sign release.  return annually or prn  An After Visit Summary was printed and given to the patient.

## 2014-08-21 NOTE — Patient Instructions (Signed)

## 2014-08-22 LAB — HEMOGLOBIN, FINGERSTICK: Hemoglobin, fingerstick: 13.8 g/dL (ref 12.0–16.0)

## 2014-08-22 LAB — VITAMIN D 25 HYDROXY (VIT D DEFICIENCY, FRACTURES): Vit D, 25-Hydroxy: 47 ng/mL (ref 30–89)

## 2014-08-23 LAB — IPS PAP TEST WITH HPV

## 2014-09-06 ENCOUNTER — Other Ambulatory Visit: Payer: Self-pay

## 2014-09-06 DIAGNOSIS — Z1231 Encounter for screening mammogram for malignant neoplasm of breast: Secondary | ICD-10-CM

## 2014-09-21 ENCOUNTER — Encounter (INDEPENDENT_AMBULATORY_CARE_PROVIDER_SITE_OTHER): Payer: Self-pay

## 2014-09-21 ENCOUNTER — Ambulatory Visit
Admission: RE | Admit: 2014-09-21 | Discharge: 2014-09-21 | Disposition: A | Discharge status: Home / Self Care | Payer: 59 | Source: Ambulatory Visit

## 2014-09-21 DIAGNOSIS — Z1231 Encounter for screening mammogram for malignant neoplasm of breast: Secondary | ICD-10-CM

## 2015-08-23 ENCOUNTER — Encounter: Payer: Self-pay | Admitting: Obstetrics and Gynecology

## 2015-08-23 ENCOUNTER — Ambulatory Visit (INDEPENDENT_AMBULATORY_CARE_PROVIDER_SITE_OTHER): Payer: Managed Care, Other (non HMO) | Admitting: Obstetrics and Gynecology

## 2015-08-23 VITALS — BP 140/88 | HR 66 | Resp 16 | Ht 65.5 in | Wt 133.4 lb

## 2015-08-23 DIAGNOSIS — Z01419 Encounter for gynecological examination (general) (routine) without abnormal findings: Secondary | ICD-10-CM | POA: Diagnosis not present

## 2015-08-23 DIAGNOSIS — M8580 Other specified disorders of bone density and structure, unspecified site: Secondary | ICD-10-CM

## 2015-08-23 DIAGNOSIS — N952 Postmenopausal atrophic vaginitis: Secondary | ICD-10-CM

## 2015-08-23 DIAGNOSIS — Z0000 Encounter for general adult medical examination without abnormal findings: Secondary | ICD-10-CM

## 2015-08-23 LAB — POCT URINALYSIS DIPSTICK
BILIRUBIN UA: NEGATIVE
Glucose, UA: NEGATIVE
Ketones, UA: NEGATIVE
Leukocytes, UA: NEGATIVE
NITRITE UA: NEGATIVE
Protein, UA: NEGATIVE
RBC UA: NEGATIVE
UROBILINOGEN UA: NEGATIVE
pH, UA: 5

## 2015-08-23 LAB — CBC
HEMATOCRIT: 38.8 % (ref 36.0–46.0)
HEMOGLOBIN: 13.1 g/dL (ref 12.0–15.0)
MCH: 31.9 pg (ref 26.0–34.0)
MCHC: 33.8 g/dL (ref 30.0–36.0)
MCV: 94.4 fL (ref 78.0–100.0)
MPV: 9.2 fL (ref 8.6–12.4)
Platelets: 272 10*3/uL (ref 150–400)
RBC: 4.11 MIL/uL (ref 3.87–5.11)
RDW: 12.7 % (ref 11.5–15.5)
WBC: 3.9 10*3/uL — AB (ref 4.0–10.5)

## 2015-08-23 LAB — HEMOGLOBIN, FINGERSTICK: HEMOGLOBIN, FINGERSTICK: 13.2 g/dL (ref 12.0–16.0)

## 2015-08-23 MED ORDER — ESTRADIOL 10 MCG VA TABS: ORAL_TABLET | VAGINAL | Status: DC

## 2015-08-23 NOTE — Patient Instructions (Signed)

## 2015-08-23 NOTE — Progress Notes (Addendum)
Patient ID: Monique Brown, female   DOB: January 15, 1961, 54 y.o.   MRN: [id number] 54 y.o. [id number] Married Caucasian female here for annual exam.    Good bladder control.   No hot flashes.  Having vaginal dryness.   Has 2 grand babies - girls.   PCP: Laurann Montana, MD    No LMP recorded. Patient has had a hysterectomy.          Sexually active: Yes.   female The current method of family planning is status post hysterectomy. 2009.  Had cystocele repair and graft replacement.  Exercising: Yes.    walking Smoker:  no  Health Maintenance: Pap:  08-21-14 Neg:Neg HR HPV History of abnormal Pap:  Yes, 2005 hx LEEP procedure--CIN II.  Pap 2010 ASCUS and positive HR HPV.  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 Pap 2011 ASCUS and negative HR HPV.  Pap 2012 ASCUS and negative HR HPV.  Colposcopy 2011 VAIN I. MMG:  09-21-14 3D density Cat.C/Neg/BiRads[facility] Colonoscopy:  2010 normal with [facility]. Next due 2020. BMD:   10-03-11  Result  Osteopenia. TDaP:  PCP Screening Labs:  Hb today: 13.2, Urine today: Neg   reports that she has never smoked. She does not have any smokeless tobacco history on file. She reports that she drinks about 4.2 oz of alcohol per week. She reports that she does not use illicit drugs.  Past Medical History  Diagnosis Date  . 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 ectocervix margin  . VAIN I (vaginal intraepithelial neoplasia grade I) 2011  . STD (sexually transmitted disease)     HPV    Past Surgical History  Procedure Laterality Date  . Appendectomy  07-15-08  . Abdominal hysterectomy  05-23-08    TVH, cystocele repair w/graft & cystoscopy.  Ovaries remain  . Vain i    . Cervical biopsy  w/ loop electrode excision  08-02-2004    CIN II, Pos. ectocervix margin    No current outpatient prescriptions on file.   No current facility-administered medications for this visit.    Family History  Problem Relation Age of Onset  .  Hypertension Mother   . Stroke Father   . Hypertension Father   . Diabetes Brother   . Diabetes Brother     ROS:  Pertinent items are noted in HPI.  Otherwise, a comprehensive ROS was negative.  Exam:   BP 140/88 mmHg  Pulse 66  Resp 16  Ht 5' 5.5" (1.664 m)  Wt 133 lb 6.4 oz (60.51 kg)  BMI 21.85 kg/m2    General appearance: alert, cooperative and appears stated age Head: Normocephalic, without obvious abnormality, atraumatic Neck: no adenopathy, supple, symmetrical, trachea midline and thyroid normal to inspection and palpation Lungs: clear to auscultation bilaterally Breasts: normal appearance, no masses or tenderness, Inspection negative, No nipple retraction or dimpling, No nipple discharge or bleeding, No axillary or supraclavicular adenopathy Heart: regular rate and rhythm Abdomen: soft, non-tender; bowel sounds normal; no masses,  no organomegaly Extremities: extremities normal, atraumatic, no cyanosis or edema Skin: Skin color, texture, turgor normal. No rashes or lesions Lymph nodes: Cervical, supraclavicular, and axillary nodes normal. No abnormal inguinal nodes palpated Neurologic: Grossly normal  Pelvic: External genitalia:  no lesions              Urethra:  normal appearing urethra with no masses, tenderness or lesions              Bartholins and Skenes:  normal                 Vagina: normal appearing vagina with normal color and discharge, no lesions              Cervix: absent              Pap taken: No. Bimanual Exam:  Uterus:  uterus absent              Adnexa: normal adnexa and no mass, fullness, tenderness              Rectovaginal: Yes.  .  Confirms.              Anus:  normal sphincter tone, no lesions  Chaperone was present for exam.  Assessment:   Well woman visit with normal exam. Hx CIN II and VAIN I. Status post LEEP. Status post hysterectomy.  Benign cervix in 2009. Osteopenia.  Vaginal atrophy.   Plan: Yearly mammogram recommended after  age 54.  Recommended self breast exam.  Pap and HR HPV as above.  Pap next year.  Recommendations for Calcium, Vitamin D, regular exercise program including cardiovascular and weight bearing exercise. Labs performed.  Yes.  .   See orders. Refills given on medications.  Yes.  .  See orders.  Vagifem.  Instructed in used. Discussed risks of DVT, PE, MI, stroke, breast cancer.  [facility]   Follow up annually and prn.     After visit summary provided.

## 2015-08-24 LAB — COMPREHENSIVE METABOLIC PANEL
ALK PHOS: 48 U/L (ref 33–130)
ALT: 17 U/L (ref 6–29)
AST: 23 U/L (ref 10–35)
Albumin: 4.3 g/dL (ref 3.6–5.1)
BUN: 9 mg/dL (ref 7–25)
CALCIUM: 9.5 mg/dL (ref 8.6–10.4)
CHLORIDE: 101 mmol/L (ref 98–110)
CO2: 27 mmol/L (ref 20–31)
Creat: 0.75 mg/dL (ref 0.50–1.05)
Glucose, Bld: 78 mg/dL (ref 65–99)
POTASSIUM: 4.2 mmol/L (ref 3.5–5.3)
Sodium: 139 mmol/L (ref 135–146)
TOTAL PROTEIN: 6.4 g/dL (ref 6.1–8.1)
Total Bilirubin: 0.4 mg/dL (ref 0.2–1.2)

## 2015-08-24 LAB — LIPID PANEL
CHOL/HDL RATIO: 2.8 ratio (ref <=5.0)
Cholesterol: 247 mg/dL — ABNORMAL HIGH (ref 125–200)
HDL: 87 mg/dL (ref >=46)
LDL CALC: 141 mg/dL — AB (ref <130)
Triglycerides: 94 mg/dL (ref <150)
VLDL: 19 mg/dL (ref <30)

## 2015-08-28 ENCOUNTER — Telehealth: Payer: Self-pay

## 2015-08-28 NOTE — Telephone Encounter (Signed)
Left message to call Merina Behrendt at [phone]. 

## 2015-08-28 NOTE — Telephone Encounter (Signed)
-----   Message from Patton SallesBrook E Amundson C Silva, MD sent at 08/24/2015  6:48 AM EST ----- Please share results of blood work with patient.  Cholesterol ratios are good but the LDL does remain slightly high. Patient may want to start watching the cholesterol in her diet.  Do regular exercise.  The WBC is slightly low but just barely so.  This is the same as last year.  CMP is normal. I recommend repeat labs in one year.   Cc -  Claudette LawsAmanda Dixon

## 2015-08-29 NOTE — Telephone Encounter (Signed)
Spoke with patient. Advised of results as seen below. Patient is agreeable and verbalizes understanding.  Routing to provider for final review. Patient agreeable to disposition. Will close encounter.     

## 2016-09-11 ENCOUNTER — Ambulatory Visit: Payer: Managed Care, Other (non HMO) | Admitting: Obstetrics and Gynecology

## 2017-10-13 DIAGNOSIS — M810 Age-related osteoporosis without current pathological fracture: Secondary | ICD-10-CM

## 2017-10-13 HISTORY — DX: Age-related osteoporosis without current pathological fracture: M81.0

## 2018-02-08 ENCOUNTER — Other Ambulatory Visit: Payer: Self-pay | Admitting: Obstetrics and Gynecology

## 2018-02-08 DIAGNOSIS — Z1231 Encounter for screening mammogram for malignant neoplasm of breast: Secondary | ICD-10-CM

## 2018-02-26 ENCOUNTER — Encounter: Payer: Self-pay | Admitting: Obstetrics and Gynecology

## 2018-02-26 ENCOUNTER — Other Ambulatory Visit (HOSPITAL_COMMUNITY)
Admission: RE | Admit: 2018-02-26 | Discharge: 2018-02-26 | Disposition: A | Discharge status: Home / Self Care | Payer: BLUE CROSS/BLUE SHIELD | Source: Ambulatory Visit | Attending: Obstetrics and Gynecology | Admitting: Obstetrics and Gynecology

## 2018-02-26 ENCOUNTER — Ambulatory Visit: Payer: BLUE CROSS/BLUE SHIELD | Admitting: Obstetrics and Gynecology

## 2018-02-26 ENCOUNTER — Other Ambulatory Visit: Payer: Self-pay

## 2018-02-26 VITALS — BP 112/60 | HR 64 | Resp 16 | Ht 65.25 in | Wt 131.0 lb

## 2018-02-26 DIAGNOSIS — Z01419 Encounter for gynecological examination (general) (routine) without abnormal findings: Secondary | ICD-10-CM | POA: Diagnosis not present

## 2018-02-26 DIAGNOSIS — Z90710 Acquired absence of both cervix and uterus: Secondary | ICD-10-CM | POA: Insufficient documentation

## 2018-02-26 DIAGNOSIS — Z87411 Personal history of vaginal dysplasia: Secondary | ICD-10-CM | POA: Insufficient documentation

## 2018-02-26 DIAGNOSIS — Z1151 Encounter for screening for human papillomavirus (HPV): Secondary | ICD-10-CM | POA: Insufficient documentation

## 2018-02-26 DIAGNOSIS — M8580 Other specified disorders of bone density and structure, unspecified site: Secondary | ICD-10-CM

## 2018-02-26 DIAGNOSIS — Z87410 Personal history of cervical dysplasia: Secondary | ICD-10-CM | POA: Diagnosis not present

## 2018-02-26 NOTE — Progress Notes (Signed)
57 y.o. [id number] Married Caucasian female here for annual exam.    Family members moving back home.   PCP: Dr. Laurann Montana     Patient's last menstrual period was 10/13/2000 (within years).           Sexually active: Yes.    The current method of family planning is status post hysterectomy -- ovaries remain.    Exercising: Yes.    walking Smoker:  no  Health Maintenance: Pap:  08-21-14 Neg:Neg HR HPV History of abnormal Pap:  Yes, 2005 hx LEEP procedure--CIN II.  Pap 2010 ASCUS and positive HR HPV.  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 Pap 2011 ASCUS and negative HR HPV.  Pap 2012 ASCUS and negative HR HPV.  Colposcopy 2011 VAIN I. MMG:  09/21/14 BIRADS 1 negative/density c -- scheduled 03/04/18 TBC Colonoscopy:  2010 normal with [facility]. Next due 2020 BMD:   10/03/11  Result  Osteopenia TDaP:  PCP Gardasil:   n/a HIV: never Hep C: never Screening Labs:  Discuss today   reports that she has never smoked. She has never used smokeless tobacco. She reports that she drinks about 4.2 oz of alcohol per week. She reports that she does not use drugs.  Past Medical History:  Diagnosis Date  . 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 ectocervix margin  . STD (sexually transmitted disease)    HPV  . VAIN I (vaginal intraepithelial neoplasia grade I) 2011    Past Surgical History:  Procedure Laterality Date  . ABDOMINAL HYSTERECTOMY  05-23-08   TVH, cystocele repair w/graft & cystoscopy.  Ovaries remain  . APPENDECTOMY  07-15-08  . CERVICAL BIOPSY  W/ LOOP ELECTRODE EXCISION  08-02-2004   CIN II, Pos. ectocervix margin  . VAIN I      Current Outpatient Medications  Medication Sig Dispense Refill  . Estradiol 10 MCG TABS vaginal tablet Please one tablet (10 mcg) per vagina at bedtime nightly for 2 weeks. Then place one tablet per vagina at bedtime twice per week. (Patient not taking: Reported on 02/26/2018) 18 tablet 11   No current facility-administered medications for  this visit.     Family History  Problem Relation Age of Onset  . Hypertension Mother   . Stroke Father   . Hypertension Father   . Diabetes Brother   . Diabetes Brother     Review of Systems  Constitutional: Negative.   HENT: Negative.   Eyes: Negative.   Respiratory: Negative.   Cardiovascular: Negative.   Gastrointestinal: Negative.   Endocrine: Negative.   Genitourinary: Negative.   Musculoskeletal: Negative.   Skin: Negative.   Allergic/Immunologic: Negative.   Neurological: Negative.   Hematological: Negative.   Psychiatric/Behavioral: Negative.     Exam:   BP 112/60 (BP Location: Right Arm, Patient Position: Sitting, Cuff Size: Normal)   Pulse 64   Resp 16   Ht 5' 5.25" (1.657 m)   Wt 131 lb (59.4 kg)   LMP 10/13/2000 (Within Years)   BMI 21.63 kg/m     General appearance: alert, cooperative and appears stated age Head: Normocephalic, without obvious abnormality, atraumatic Neck: no adenopathy, supple, symmetrical, trachea midline and thyroid normal to inspection and palpation Lungs: clear to auscultation bilaterally Breasts: normal appearance, no masses or tenderness, No nipple retraction or dimpling, No nipple discharge or bleeding, No axillary or supraclavicular adenopathy Heart: regular rate and rhythm Abdomen: soft, non-tender; no masses, no organomegaly Extremities: extremities normal, atraumatic, no cyanosis or edema Skin: Skin  color, texture, turgor normal. No rashes or lesions Lymph nodes: Cervical, supraclavicular, and axillary nodes normal. No abnormal inguinal nodes palpated Neurologic: Grossly normal  Pelvic: External genitalia:  no lesions              Urethra:  normal appearing urethra with no masses, tenderness or lesions              Bartholins and Skenes: normal                 Vagina: normal appearing vagina with normal color and discharge, no lesions              Cervix: absent              Pap taken: Yes.   Bimanual Exam:  Uterus:    absent              Adnexa: no mass, fullness, tenderness              Rectal exam: Yes.  .  Confirms.              Anus:  normal sphincter tone, no lesions  Chaperone was present for exam.  Assessment:   Well woman visit with normal exam. Hx CIN II and VAIN I. Status post LEEP. Status post hysterectomy.  Benign cervix in 2009. Osteopenia.   Plan: Mammogram screening. Recommended self breast awareness. Pap and HR HPV as above. Guidelines for Calcium, Vitamin D, regular exercise program including cardiovascular and weight bearing exercise. Routine labs.  BMD ordered. She will reconnect with her PCP. Follow up annually and prn.   After visit summary provided.

## 2018-02-26 NOTE — Patient Instructions (Signed)

## 2018-02-27 LAB — CBC
Hematocrit: 41.3 % (ref 34.0–46.6)
Hemoglobin: 13.3 g/dL (ref 11.1–15.9)
MCH: 31 pg (ref 26.6–33.0)
MCHC: 32.2 g/dL (ref 31.5–35.7)
MCV: 96 fL (ref 79–97)
Platelets: 282 10*3/uL (ref 150–379)
RBC: 4.29 x10E6/uL (ref 3.77–5.28)
RDW: 13.1 % (ref 12.3–15.4)
WBC: 4.5 10*3/uL (ref 3.4–10.8)

## 2018-02-27 LAB — COMPREHENSIVE METABOLIC PANEL
A/G RATIO: 2.5 — AB (ref 1.2–2.2)
ALK PHOS: 57 IU/L (ref 39–117)
ALT: 18 IU/L (ref 0–32)
AST: 20 IU/L (ref 0–40)
Albumin: 4.9 g/dL (ref 3.5–5.5)
BILIRUBIN TOTAL: 0.3 mg/dL (ref 0.0–1.2)
BUN/Creatinine Ratio: 17 (ref 9–23)
BUN: 14 mg/dL (ref 6–24)
CALCIUM: 9.9 mg/dL (ref 8.7–10.2)
CHLORIDE: 100 mmol/L (ref 96–106)
CO2: 25 mmol/L (ref 20–29)
Creatinine, Ser: 0.81 mg/dL (ref 0.57–1.00)
GFR calc Af Amer: 93 mL/min/{1.73_m2} (ref >59)
GFR calc non Af Amer: 80 mL/min/{1.73_m2} (ref >59)
Globulin, Total: 2 g/dL (ref 1.5–4.5)
Glucose: 86 mg/dL (ref 65–99)
POTASSIUM: 4.2 mmol/L (ref 3.5–5.2)
Sodium: 141 mmol/L (ref 134–144)
Total Protein: 6.9 g/dL (ref 6.0–8.5)

## 2018-02-27 LAB — LIPID PANEL
Chol/HDL Ratio: 2.8 ratio (ref 0.0–4.4)
Cholesterol, Total: 294 mg/dL — ABNORMAL HIGH (ref 100–199)
HDL: 105 mg/dL (ref >39)
LDL Calculated: 174 mg/dL — ABNORMAL HIGH (ref 0–99)
TRIGLYCERIDES: 77 mg/dL (ref 0–149)
VLDL Cholesterol Cal: 15 mg/dL (ref 5–40)

## 2018-02-27 LAB — TSH: TSH: 2.31 u[IU]/mL (ref 0.450–4.500)

## 2018-02-27 LAB — VITAMIN D 25 HYDROXY (VIT D DEFICIENCY, FRACTURES): Vit D, 25-Hydroxy: 32.2 ng/mL (ref 30.0–100.0)

## 2018-03-01 ENCOUNTER — Encounter: Payer: Self-pay | Admitting: Obstetrics and Gynecology

## 2018-03-02 LAB — CYTOLOGY - PAP
DIAGNOSIS: NEGATIVE
HPV (WINDOPATH): NOT DETECTED

## 2018-03-04 ENCOUNTER — Ambulatory Visit
Admission: RE | Admit: 2018-03-04 | Discharge: 2018-03-04 | Disposition: A | Discharge status: Home / Self Care | Payer: BLUE CROSS/BLUE SHIELD | Source: Ambulatory Visit | Attending: Obstetrics and Gynecology | Admitting: Obstetrics and Gynecology

## 2018-03-04 DIAGNOSIS — Z1231 Encounter for screening mammogram for malignant neoplasm of breast: Secondary | ICD-10-CM

## 2018-04-12 ENCOUNTER — Ambulatory Visit
Admission: RE | Admit: 2018-04-12 | Discharge: 2018-04-12 | Disposition: A | Discharge status: Home / Self Care | Payer: BLUE CROSS/BLUE SHIELD | Source: Ambulatory Visit | Attending: Obstetrics and Gynecology | Admitting: Obstetrics and Gynecology

## 2018-04-12 ENCOUNTER — Encounter: Payer: Self-pay | Admitting: Obstetrics and Gynecology

## 2018-04-12 DIAGNOSIS — M810 Age-related osteoporosis without current pathological fracture: Secondary | ICD-10-CM | POA: Diagnosis not present

## 2018-04-12 DIAGNOSIS — Z780 Asymptomatic menopausal state: Secondary | ICD-10-CM | POA: Diagnosis not present

## 2018-04-12 DIAGNOSIS — M8580 Other specified disorders of bone density and structure, unspecified site: Secondary | ICD-10-CM

## 2018-04-12 DIAGNOSIS — M85852 Other specified disorders of bone density and structure, left thigh: Secondary | ICD-10-CM | POA: Diagnosis not present

## 2018-04-13 ENCOUNTER — Telehealth: Payer: Self-pay | Admitting: *Deleted

## 2018-04-13 NOTE — Telephone Encounter (Signed)
-----   Message from Patton SallesBrook E Amundson C Silva, MD sent at 04/12/2018 10:07 PM EDT ----- Please report results of BMD showing osteoporosis of the spine.  She has lost significant bond density of the spine and hip since her last BMD in 2012.  She is at risk for fracture.  Please schedule and appointment with me to discuss further.

## 2018-04-13 NOTE — Telephone Encounter (Signed)
Left message to call Joshuan Bolander at [phone].  

## 2018-04-13 NOTE — Telephone Encounter (Signed)
Patient returning call.

## 2018-04-13 NOTE — Telephone Encounter (Signed)
Notes recorded by Leda MinHamm, Lewis Grivas N, RN on 04/13/2018 at 10:07 AM EDT Left message to call Noreene LarssonJill at [phone].

## 2018-04-16 NOTE — Telephone Encounter (Signed)
Spoke with patient. Results given. Patient verbalizes understanding. Appointment scheduled for 04/19/2018 at 11:30 am with Dr.Silva. Patient is agreeable to date and time. Encounter closed.

## 2018-04-19 ENCOUNTER — Ambulatory Visit: Payer: BLUE CROSS/BLUE SHIELD | Admitting: Obstetrics and Gynecology

## 2018-04-19 NOTE — Progress Notes (Deleted)
GYNECOLOGY  VISIT   HPI: 57 y.o.   Married  Caucasian  female   [phone] with Patient's last menstrual period was 10/13/2000 (within years).   here for discuss BMD results     GYNECOLOGIC HISTORY: Patient's last menstrual period was 10/13/2000 (within years). Contraception:  Hysterectomy -- ovaries remain Menopausal hormone therapy:  Estradiol vaginal tablets Last mammogram:  03/04/18 BIRADS 1 negative/density c Last pap smear:   02/26/18 Pap and HR HPV negative        OB History    Gravida  3   Para  3   Term  3   Preterm      AB      Living  3     SAB      TAB      Ectopic      Multiple      Live Births                 There are no active problems to display for this patient.   Past Medical History:  Diagnosis Date  . 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 ectocervix margin  . Elevated LDL cholesterol level   . Osteoporosis 2019  . STD (sexually transmitted disease)    HPV  . VAIN I (vaginal intraepithelial neoplasia grade I) 2011    Past Surgical History:  Procedure Laterality Date  . ABDOMINAL HYSTERECTOMY  05-23-08   TVH, cystocele repair w/graft & cystoscopy.  Ovaries remain  . APPENDECTOMY  07-15-08  . CERVICAL BIOPSY  W/ LOOP ELECTRODE EXCISION  08-02-2004   CIN II, Pos. ectocervix margin  . VAIN I      Current Outpatient Medications  Medication Sig Dispense Refill  . Estradiol 10 MCG TABS vaginal tablet Please one tablet (10 mcg) per vagina at bedtime nightly for 2 weeks. Then place one tablet per vagina at bedtime twice per week. (Patient not taking: Reported on 02/26/2018) 18 tablet 11   No current facility-administered medications for this visit.      ALLERGIES: Patient has no known allergies.  Family History  Problem Relation Age of Onset  . Hypertension Mother   . Stroke Father   . Hypertension Father   . Diabetes Brother   . Diabetes Brother     Social History   Socioeconomic History  . Marital  status: Married    Spouse name: Not on file  . Number of children: Not on file  . Years of education: Not on file  . Highest education level: Not on file  Occupational History  . Not on file  Social Needs  . Financial resource strain: Not on file  . Food insecurity:    Worry: Not on file    Inability: Not on file  . Transportation needs:    Medical: Not on file    Non-medical: Not on file  Tobacco Use  . Smoking status: Never Smoker  . Smokeless tobacco: Never Used  Substance and Sexual Activity  . Alcohol use: Yes    Alcohol/week: 4.2 oz    Types: 7 Standard drinks or equivalent per week    Comment: 7 glasses of wine per week  . Drug use: No  . Sexual activity: Yes    Partners: Male    Birth control/protection: Surgical    Comment: TVH--still has ovaries  Lifestyle  . Physical activity:    Days per week: Not on file    Minutes per session: Not on file  .  Stress: Not on file  Relationships  . Social connections:    Talks on phone: Not on file    Gets together: Not on file    Attends religious service: Not on file    Active member of club or organization: Not on file    Attends meetings of clubs or organizations: Not on file    Relationship status: Not on file  . Intimate partner violence:    Fear of current or ex partner: Not on file    Emotionally abused: Not on file    Physically abused: Not on file    Forced sexual activity: Not on file  Other Topics Concern  . Not on file  Social History Narrative  . Not on file    Review of Systems  PHYSICAL EXAMINATION:    LMP 10/13/2000 (Within Years)     General appearance: alert, cooperative and appears stated age Head: Normocephalic, without obvious abnormality, atraumatic Neck: no adenopathy, supple, symmetrical, trachea midline and thyroid normal to inspection and palpation Lungs: clear to auscultation bilaterally Breasts: normal appearance, no masses or tenderness, No nipple retraction or dimpling, No nipple  discharge or bleeding, No axillary or supraclavicular adenopathy Heart: regular rate and rhythm Abdomen: soft, non-tender, no masses,  no organomegaly Extremities: extremities normal, atraumatic, no cyanosis or edema Skin: Skin color, texture, turgor normal. No rashes or lesions Lymph nodes: Cervical, supraclavicular, and axillary nodes normal. No abnormal inguinal nodes palpated Neurologic: Grossly normal  Pelvic: External genitalia:  no lesions              Urethra:  normal appearing urethra with no masses, tenderness or lesions              Bartholins and Skenes: normal                 Vagina: normal appearing vagina with normal color and discharge, no lesions              Cervix: no lesions                Bimanual Exam:  Uterus:  normal size, contour, position, consistency, mobility, non-tender              Adnexa: no mass, fullness, tenderness              Rectal exam: {yes no:[id number]}.  Confirms.              Anus:  normal sphincter tone, no lesions  Chaperone was present for exam.  ASSESSMENT     PLAN     An After Visit Summary was printed and given to the patient.  ______ minutes face to face time of which over 50% was spent in counseling.

## 2018-04-26 ENCOUNTER — Other Ambulatory Visit: Payer: Self-pay

## 2018-04-26 ENCOUNTER — Ambulatory Visit (INDEPENDENT_AMBULATORY_CARE_PROVIDER_SITE_OTHER): Payer: BLUE CROSS/BLUE SHIELD | Admitting: Obstetrics and Gynecology

## 2018-04-26 ENCOUNTER — Encounter: Payer: Self-pay | Admitting: Obstetrics and Gynecology

## 2018-04-26 VITALS — BP 140/88 | HR 80 | Resp 16 | Ht 65.25 in | Wt 131.0 lb

## 2018-04-26 DIAGNOSIS — M810 Age-related osteoporosis without current pathological fracture: Secondary | ICD-10-CM

## 2018-04-26 MED ORDER — ALENDRONATE SODIUM 70 MG PO TABS: 70.0000 mg | ORAL_TABLET | ORAL | 0 refills | Status: DC

## 2018-04-26 NOTE — Progress Notes (Signed)
GYNECOLOGY  VISIT   HPI: 57 y.o.   Married  Caucasian  female   [phone] with Patient's last menstrual period was 10/13/2000 (within years).   here for BMD consult.  BMD 7/1/1:  Osteoporosis.  T score spine -3.0 Left hip -1.8.   GYNECOLOGIC HISTORY: Patient's last menstrual period was 10/13/2000 (within years). Contraception:  Hysterectomy -- ovaries remain Menopausal hormone therapy: none Last mammogram:  03/04/18 BIRADS 1 negative/density c Last pap smear:   02/26/18 Neg:Neg HR HPV        OB History    Gravida  3   Para  3   Term  3   Preterm      AB      Living  3     SAB      TAB      Ectopic      Multiple      Live Births                 There are no active problems to display for this patient.   Past Medical History:  Diagnosis Date  . 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 ectocervix margin  . Elevated LDL cholesterol level   . Osteoporosis 2019  . STD (sexually transmitted disease)    HPV  . VAIN I (vaginal intraepithelial neoplasia grade I) 2011    Past Surgical History:  Procedure Laterality Date  . ABDOMINAL HYSTERECTOMY  05-23-08   TVH, cystocele repair w/graft & cystoscopy.  Ovaries remain  . APPENDECTOMY  07-15-08  . CERVICAL BIOPSY  W/ LOOP ELECTRODE EXCISION  08-02-2004   CIN II, Pos. ectocervix margin  . VAIN I      Current Outpatient Medications  Medication Sig Dispense Refill  . Estradiol 10 MCG TABS vaginal tablet Please one tablet (10 mcg) per vagina at bedtime nightly for 2 weeks. Then place one tablet per vagina at bedtime twice per week. (Patient not taking: Reported on 04/26/2018) 18 tablet 11   No current facility-administered medications for this visit.      ALLERGIES: Patient has no known allergies.  Family History  Problem Relation Age of Onset  . Hypertension Mother   . Stroke Father   . Hypertension Father   . Diabetes Brother   . Diabetes Brother     Social History    Socioeconomic History  . Marital status: Married    Spouse name: Not on file  . Number of children: Not on file  . Years of education: Not on file  . Highest education level: Not on file  Occupational History  . Not on file  Social Needs  . Financial resource strain: Not on file  . Food insecurity:    Worry: Not on file    Inability: Not on file  . Transportation needs:    Medical: Not on file    Non-medical: Not on file  Tobacco Use  . Smoking status: Never Smoker  . Smokeless tobacco: Never Used  Substance and Sexual Activity  . Alcohol use: Yes    Alcohol/week: 4.2 oz    Types: 7 Standard drinks or equivalent per week    Comment: 7 glasses of wine per week  . Drug use: No  . Sexual activity: Yes    Partners: Male    Birth control/protection: Surgical    Comment: TVH--still has ovaries  Lifestyle  . Physical activity:    Days per week: Not on file    Minutes per  session: Not on file  . Stress: Not on file  Relationships  . Social connections:    Talks on phone: Not on file    Gets together: Not on file    Attends religious service: Not on file    Active member of club or organization: Not on file    Attends meetings of clubs or organizations: Not on file    Relationship status: Not on file  . Intimate partner violence:    Fear of current or ex partner: Not on file    Emotionally abused: Not on file    Physically abused: Not on file    Forced sexual activity: Not on file  Other Topics Concern  . Not on file  Social History Narrative  . Not on file    Review of Systems  Constitutional: Negative.   HENT: Negative.   Eyes: Negative.   Respiratory: Negative.   Cardiovascular: Negative.   Gastrointestinal: Negative.   Endocrine: Negative.   Genitourinary: Negative.   Musculoskeletal: Negative.   Skin: Negative.   Allergic/Immunologic: Negative.   Neurological: Negative.   Hematological: Negative.   Psychiatric/Behavioral: Negative.     PHYSICAL  EXAMINATION:    BP 140/88 (BP Location: Right Arm, Patient Position: Sitting, Cuff Size: Normal)   Pulse 80   Resp 16   Ht 5' 5.25" (1.657 m)   Wt 131 lb (59.4 kg)   LMP 10/13/2000 (Within Years)   BMI 21.63 kg/m     General appearance: alert, cooperative and appears stated age   ASSESSMENT  Osteoporosis.  Premature menopause.   PLAN  Bone density report discussed.  Counseled regarding osteoporosis, risk factors, treatment options (bisphosphonates, Evista, Calcitonin, Prolia, Forteo). Discussed importance of weight bearing exercise and daily calcium 1200 mg daily and vit D.    Will check  PTH, calcium, phosphorus. Patient does understand that osteoporosis is a silent disease until fracture occurs and understands that there is significant morbidity associated with osteoporotic fracture.  Would like Fosamax 70 mg weekly.  Instructed in taking the medication.  Next BMD in 2 years.    An After Visit Summary was printed and given to the patient.  __25____ minutes face to face time of which over 50% was spent in counseling.

## 2018-04-26 NOTE — Patient Instructions (Signed)
Consider Up to Date for any questions about osteoporosis and Fosamax.    Alendronate tablets What is this medicine? ALENDRONATE (a LEN droe nate) slows calcium loss from bones. It helps to make normal healthy bone and to slow bone loss in people with Paget's disease and osteoporosis. It may be used in others at risk for bone loss. This medicine may be used for other purposes; ask your health care provider or pharmacist if you have questions. COMMON BRAND NAME(S): Fosamax What should I tell my health care provider before I take this medicine? They need to know if you have any of these conditions: -dental disease -esophagus, stomach, or intestine problems, like acid reflux or GERD -kidney disease -low blood calcium -low vitamin D -problems sitting or standing 30 minutes -trouble swallowing -an unusual or allergic reaction to alendronate, other medicines, foods, dyes, or preservatives -pregnant or trying to get pregnant -breast-feeding How should I use this medicine? You must take this medicine exactly as directed or you will lower the amount of the medicine you absorb into your body or you may cause yourself harm. Take this medicine by mouth first thing in the morning, after you are up for the day. Do not eat or drink anything before you take your medicine. Swallow the tablet with a full glass (6 to 8 fluid ounces) of plain water. Do not take this medicine with any other drink. Do not chew or crush the tablet. After taking this medicine, do not eat breakfast, drink, or take any medicines or vitamins for at least 30 minutes. Sit or stand up for at least 30 minutes after you take this medicine; do not lie down. Do not take your medicine more often than directed. Talk to your pediatrician regarding the use of this medicine in children. Special care may be needed. Overdosage: If you think you have taken too much of this medicine contact a poison control center or emergency room at once. NOTE: This  medicine is only for you. Do not share this medicine with others. What if I miss a dose? If you miss a dose, do not take it later in the day. Continue your normal schedule starting the next morning. Do not take double or extra doses. What may interact with this medicine? -aluminum hydroxide -antacids -aspirin -calcium supplements -drugs for inflammation like ibuprofen, naproxen, and others -iron supplements -magnesium supplements -vitamins with minerals This list may not describe all possible interactions. Give your health care provider a list of all the medicines, herbs, non-prescription drugs, or dietary supplements you use. Also tell them if you smoke, drink alcohol, or use illegal drugs. Some items may interact with your medicine. What should I watch for while using this medicine? Visit your doctor or health care professional for regular checks ups. It may be some time before you see benefit from this medicine. Do not stop taking your medicine except on your doctor's advice. Your doctor or health care professional may order blood tests and other tests to see how you are doing. You should make sure you get enough calcium and vitamin D while you are taking this medicine, unless your doctor tells you not to. Discuss the foods you eat and the vitamins you take with your health care professional. Some people who take this medicine have severe bone, joint, and/or muscle pain. This medicine may also increase your risk for a broken thigh bone. Tell your doctor right away if you have pain in your upper leg or groin. Tell your doctor  if you have any pain that does not go away or that gets worse. This medicine can make you more sensitive to the sun. If you get a rash while taking this medicine, sunlight may cause the rash to get worse. Keep out of the sun. If you cannot avoid being in the sun, wear protective clothing and use sunscreen. Do not use sun lamps or tanning beds/booths. What side effects may I  notice from receiving this medicine? Side effects that you should report to your doctor or health care professional as soon as possible: -allergic reactions like skin rash, itching or hives, swelling of the face, lips, or tongue -black or tarry stools -bone, muscle or joint pain -changes in vision -chest pain -heartburn or stomach pain -jaw pain, especially after dental work -pain or trouble when swallowing -redness, blistering, peeling or loosening of the skin, including inside the mouth Side effects that usually do not require medical attention (report to your doctor or health care professional if they continue or are bothersome): -changes in taste -diarrhea or constipation -eye pain or itching -headache -nausea or vomiting -stomach gas or fullness This list may not describe all possible side effects. Call your doctor for medical advice about side effects. You may report side effects to FDA at 1-800-FDA-1088. Where should I keep my medicine? Keep out of the reach of children. Store at room temperature of 15 and 30 degrees C (59 and 86 degrees F). Throw away any unused medicine after the expiration date. NOTE: This sheet is a summary. It may not cover all possible information. If you have questions about this medicine, talk to your doctor, pharmacist, or health care provider.  2018 Elsevier/Gold Standard (2011-03-28 08:56:09)

## 2018-04-27 DIAGNOSIS — M810 Age-related osteoporosis without current pathological fracture: Secondary | ICD-10-CM | POA: Insufficient documentation

## 2018-04-27 LAB — PHOSPHORUS: Phosphorus: 4.3 mg/dL (ref 2.5–4.5)

## 2018-04-27 LAB — PTH, INTACT AND CALCIUM
Calcium: 9.9 mg/dL (ref 8.7–10.2)
PTH: 43 pg/mL (ref 15–65)

## 2018-07-19 ENCOUNTER — Other Ambulatory Visit: Payer: Self-pay | Admitting: Obstetrics and Gynecology

## 2018-07-19 NOTE — Telephone Encounter (Signed)
Please contact patient to see how she is doing on her Fosamax medication.

## 2018-07-19 NOTE — Telephone Encounter (Signed)
Medication refill request: Fosamax Last AEX:  02/26/2018 Next AEX: 03/03/2019 Last MMG (if hormonal medication request): 03/05/2018 BI-RADS CATEGORY  1: Negative. Refill authorized: #12, 3 refills

## 2018-07-20 NOTE — Telephone Encounter (Signed)
Left detailed message for patient to contact us and let us know how she is doing on fosamax.

## 2018-07-21 ENCOUNTER — Telehealth: Payer: Self-pay | Admitting: Obstetrics and Gynecology

## 2018-07-21 NOTE — Telephone Encounter (Signed)
Patient called to follow up on a message left for her yesterday about her results. She said she'd like to continue taking the Fosamax. Pharmacy on file confirmed.

## 2018-07-21 NOTE — Telephone Encounter (Signed)
Call to work number, no answer.  Call placed to mobile number, left detailed message, ok per dpr. Thanked patient for return call and update. Advised patient refill of Fosamax 70mg  tab #12/1RF sent to [facility] [address] on 10/8, please return call to office if any  concerns/questions.   Routing to provider for final review. Patient is agreeable to disposition. Will close encounter.

## 2019-01-04 ENCOUNTER — Other Ambulatory Visit: Payer: Self-pay | Admitting: *Deleted

## 2019-01-04 MED ORDER — ALENDRONATE SODIUM 70 MG PO TABS: ORAL_TABLET | ORAL | 0 refills | Status: DC

## 2019-01-04 NOTE — Telephone Encounter (Signed)
Medication refill request: fosamax  Last AEX:  02/26/18 Dr. Edward Jolly  Next AEX: 03/03/19  Last MMG (if hormonal medication request): 03/04/18 BIRADS1:neg  Refill authorized: 07/20/18 #12/1R. Today #12/0R?

## 2019-03-03 ENCOUNTER — Ambulatory Visit: Payer: BLUE CROSS/BLUE SHIELD | Admitting: Obstetrics and Gynecology

## 2019-04-04 ENCOUNTER — Other Ambulatory Visit: Payer: Self-pay | Admitting: Obstetrics and Gynecology

## 2019-04-04 NOTE — Telephone Encounter (Signed)
Medication refill request: Fosamax Last AEX:02/26/18 Next AEX: 05/09/19 Last MMG (if hormonal medication request): 03/04/18 Bi-rads 1 neg  Refill authorized: #12/with no RF

## 2019-05-09 ENCOUNTER — Ambulatory Visit: Payer: BC Managed Care – PPO | Admitting: Obstetrics and Gynecology

## 2019-05-09 ENCOUNTER — Other Ambulatory Visit: Payer: Self-pay

## 2019-05-09 ENCOUNTER — Encounter: Payer: Self-pay | Admitting: Obstetrics and Gynecology

## 2019-05-09 ENCOUNTER — Other Ambulatory Visit (HOSPITAL_COMMUNITY)
Admission: RE | Admit: 2019-05-09 | Discharge: 2019-05-09 | Disposition: A | Discharge status: Home / Self Care | Payer: BC Managed Care – PPO | Source: Ambulatory Visit | Attending: Obstetrics and Gynecology | Admitting: Obstetrics and Gynecology

## 2019-05-09 VITALS — BP 126/68 | HR 68 | Temp 97.5°F | Resp 12 | Ht 65.25 in | Wt 127.0 lb

## 2019-05-09 DIAGNOSIS — Z01419 Encounter for gynecological examination (general) (routine) without abnormal findings: Secondary | ICD-10-CM | POA: Diagnosis not present

## 2019-05-09 MED ORDER — ALENDRONATE SODIUM 70 MG PO TABS: ORAL_TABLET | ORAL | 3 refills | Status: DC

## 2019-05-09 NOTE — Progress Notes (Signed)
58 y.o. [id number] Married Caucasian female here for annual exam.    On Fosamax.  Taking vit D 2000 IU daily.   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   PCP:   Laurann Montanaynthia White, MD  Patient's last menstrual period was 10/13/2000 (within years).           Sexually active: Yes.    The current method of family planning is status post hysterectomy -- ovaries remain.    Exercising: Yes.    walking and bike riding Smoker:  no  Health Maintenance: Pap: 02/26/18 Neg:Neg HR HPV History of abnormal Pap:  Yes, 2005 hx LEEP procedure--CIN II. Pap 2010 ASCUS and positive HR HPV. 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 Pap 2011 ASCUS and negative HR HPV. Pap 2012 ASCUS and negative HR HPV. Colposcopy 2011 VAIN I. MMG:  03/04/18 BIRADS 1 negative/density c -- patient will scheduled Colonoscopy:  2010 normal with [facility] -- due 2020 BMD:   04/12/18  Result  Osteoporosis TDaP:  PCP HIV and Hep C: never Screening Labs:  Discuss today   reports that she has never smoked. She has never used smokeless tobacco. She reports current alcohol use of about 7.0 standard drinks of alcohol per week. She reports that she does not use drugs.  Past Medical History:  Diagnosis Date  . 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 ectocervix margin  . Elevated LDL cholesterol level   . Osteoporosis 2019  . STD (sexually transmitted disease)    HPV  . VAIN I (vaginal intraepithelial neoplasia grade I) 2011    Past Surgical History:  Procedure Laterality Date  . ABDOMINAL HYSTERECTOMY  05-23-08   TVH, cystocele repair w/graft & cystoscopy.  Ovaries remain  . APPENDECTOMY  07-15-08  . CERVICAL BIOPSY  W/ LOOP ELECTRODE EXCISION  08-02-2004   CIN II, Pos. ectocervix margin  . VAIN I      Current Outpatient Medications  Medication Sig Dispense Refill  . alendronate (FOSAMAX) 70 MG tablet TAKE 1 TABLET BY MOUTH ONCE A WEEK. TAKE WITH A FULL GLASS OF WATER ON AN EMPTY STOMACH. 12 tablet 0    No current facility-administered medications for this visit.     Family History  Problem Relation Age of Onset  . Hypertension Mother   . Stroke Father   . Hypertension Father   . Diabetes Brother   . Diabetes Brother     Review of Systems  Constitutional: Negative.   HENT: Negative.   Eyes: Negative.   Respiratory: Negative.   Cardiovascular: Negative.   Gastrointestinal: Negative.   Endocrine: Negative.   Genitourinary: Negative.   Musculoskeletal: Negative.   Skin: Negative.   Allergic/Immunologic: Negative.   Neurological: Negative.   Hematological: Negative.   Psychiatric/Behavioral: Negative.     Exam:   BP 126/68 (BP Location: Left Arm, Patient Position: Sitting, Cuff Size: Normal)   Pulse 68   Temp (!) 97.5 F (36.4 C) (Temporal)   Resp 12   Ht 5' 5.25" (1.657 m)   Wt 127 lb (57.6 kg)   LMP 10/13/2000 (Within Years)   BMI 20.97 kg/m     General appearance: alert, cooperative and appears stated age Head: normocephalic, without obvious abnormality, atraumatic Neck: no adenopathy, supple, symmetrical, trachea midline and thyroid normal to inspection and palpation Lungs: clear to auscultation bilaterally Breasts: normal appearance, no masses or tenderness, No nipple retraction or dimpling, No nipple discharge or bleeding, No axillary adenopathy Heart: regular rate and rhythm  Abdomen: soft, non-tender; no masses, no organomegaly Extremities: extremities normal, atraumatic, no cyanosis or edema Skin: skin color, texture, turgor normal. No rashes or lesions Lymph nodes: cervical, supraclavicular, and axillary nodes normal. Neurologic: grossly normal  Pelvic: External genitalia:  no lesions              No abnormal inguinal nodes palpated.              Urethra:  normal appearing urethra with no masses, tenderness or lesions              Bartholins and Skenes: normal                 Vagina: normal appearing vagina with normal color and discharge, no  lesions              Cervix: absent              Pap taken: Yes.   Bimanual Exam:  Uterus:  Absent.               Adnexa: no mass, fullness, tenderness              Rectal exam: Yes.  .  Confirms.              Anus:  normal sphincter tone, no lesions  Chaperone was present for exam.  Assessment:   Well woman visit with normal exam. Hx CIN II and VAIN I. Status post LEEP. Status post hysterectomy. Benign cervix in 2009. Osteoporosis. On Fosamax.   Plan: Mammogram screening discussed. Self breast awareness reviewed. Pap and HR HPV as above. Guidelines for Calcium, Vitamin D, regular exercise program including cardiovascular and weight bearing exercise. BMD next year.  Refill of Fosamax to complete one year.  Discussed IFOB, Cologuard, and colonoscopy.  She will contact [facility] to schedule colonoscopy.  Routine labs.  Follow up annually and prn.     After visit summary provided.

## 2019-05-09 NOTE — Patient Instructions (Signed)

## 2019-05-10 LAB — COMPREHENSIVE METABOLIC PANEL
ALT: 19 IU/L (ref 0–32)
AST: 24 IU/L (ref 0–40)
Albumin/Globulin Ratio: 2.3 — ABNORMAL HIGH (ref 1.2–2.2)
Albumin: 4.6 g/dL (ref 3.8–4.9)
Alkaline Phosphatase: 45 IU/L (ref 39–117)
BUN/Creatinine Ratio: 14 (ref 12–28)
BUN: 11 mg/dL (ref 8–27)
Bilirubin Total: 0.2 mg/dL (ref 0.0–1.2)
CO2: 23 mmol/L (ref 20–29)
Calcium: 9.1 mg/dL (ref 8.7–10.3)
Chloride: 101 mmol/L (ref 96–106)
Creatinine, Ser: 0.77 mg/dL (ref 0.57–1.00)
GFR calc Af Amer: 97 mL/min/{1.73_m2} (ref >59)
GFR calc non Af Amer: 84 mL/min/{1.73_m2} (ref >59)
Globulin, Total: 2 g/dL (ref 1.5–4.5)
Glucose: 90 mg/dL (ref 65–99)
Potassium: 3.6 mmol/L (ref 3.5–5.2)
Sodium: 140 mmol/L (ref 134–144)
Total Protein: 6.6 g/dL (ref 6.0–8.5)

## 2019-05-10 LAB — CBC
Hematocrit: 37.5 % (ref 34.0–46.6)
Hemoglobin: 13 g/dL (ref 11.1–15.9)
MCH: 32.5 pg (ref 26.6–33.0)
MCHC: 34.7 g/dL (ref 31.5–35.7)
MCV: 94 fL (ref 79–97)
Platelets: 230 10*3/uL (ref 150–450)
RBC: 4 x10E6/uL (ref 3.77–5.28)
RDW: 11.8 % (ref 11.7–15.4)
WBC: 4.3 10*3/uL (ref 3.4–10.8)

## 2019-05-10 LAB — LIPID PANEL
Chol/HDL Ratio: 2.8 ratio (ref 0.0–4.4)
Cholesterol, Total: 255 mg/dL — ABNORMAL HIGH (ref 100–199)
HDL: 92 mg/dL (ref >39)
LDL Calculated: 149 mg/dL — ABNORMAL HIGH (ref 0–99)
Triglycerides: 71 mg/dL (ref 0–149)
VLDL Cholesterol Cal: 14 mg/dL (ref 5–40)

## 2019-05-11 LAB — CYTOLOGY - PAP
Diagnosis: NEGATIVE
HPV: NOT DETECTED

## 2019-06-10 ENCOUNTER — Other Ambulatory Visit: Payer: Self-pay | Admitting: Obstetrics and Gynecology

## 2019-06-10 DIAGNOSIS — Z1231 Encounter for screening mammogram for malignant neoplasm of breast: Secondary | ICD-10-CM

## 2019-07-26 ENCOUNTER — Ambulatory Visit
Admission: RE | Admit: 2019-07-26 | Discharge: 2019-07-26 | Disposition: A | Discharge status: Home / Self Care | Payer: BC Managed Care – PPO | Source: Ambulatory Visit | Attending: Obstetrics and Gynecology | Admitting: Obstetrics and Gynecology

## 2019-07-26 ENCOUNTER — Other Ambulatory Visit: Payer: Self-pay

## 2019-07-26 DIAGNOSIS — Z1231 Encounter for screening mammogram for malignant neoplasm of breast: Secondary | ICD-10-CM | POA: Diagnosis not present

## 2019-08-09 DIAGNOSIS — Z20828 Contact with and (suspected) exposure to other viral communicable diseases: Secondary | ICD-10-CM | POA: Diagnosis not present

## 2019-11-08 DIAGNOSIS — Z0000 Encounter for general adult medical examination without abnormal findings: Secondary | ICD-10-CM | POA: Diagnosis not present

## 2019-12-22 DIAGNOSIS — Z23 Encounter for immunization: Secondary | ICD-10-CM | POA: Diagnosis not present

## 2020-02-27 DIAGNOSIS — Z1211 Encounter for screening for malignant neoplasm of colon: Secondary | ICD-10-CM | POA: Diagnosis not present

## 2020-02-27 DIAGNOSIS — K5730 Diverticulosis of large intestine without perforation or abscess without bleeding: Secondary | ICD-10-CM | POA: Diagnosis not present

## 2020-03-15 DIAGNOSIS — Z23 Encounter for immunization: Secondary | ICD-10-CM | POA: Diagnosis not present

## 2020-06-04 ENCOUNTER — Encounter: Payer: Self-pay | Admitting: Obstetrics and Gynecology

## 2020-06-04 ENCOUNTER — Other Ambulatory Visit: Payer: Self-pay

## 2020-06-04 ENCOUNTER — Ambulatory Visit (INDEPENDENT_AMBULATORY_CARE_PROVIDER_SITE_OTHER): Payer: 59 | Admitting: Obstetrics and Gynecology

## 2020-06-04 VITALS — BP 122/68 | HR 60 | Resp 14 | Ht 65.0 in | Wt 129.0 lb

## 2020-06-04 DIAGNOSIS — Z8739 Personal history of other diseases of the musculoskeletal system and connective tissue: Secondary | ICD-10-CM | POA: Diagnosis not present

## 2020-06-04 DIAGNOSIS — Z01419 Encounter for gynecological examination (general) (routine) without abnormal findings: Secondary | ICD-10-CM

## 2020-06-04 MED ORDER — ALENDRONATE SODIUM 70 MG PO TABS: ORAL_TABLET | ORAL | 3 refills | Status: DC

## 2020-06-04 NOTE — Progress Notes (Signed)
59 y.o. G67P3003 Married Caucasian female here for annual exam.    Retired in June, 2021.  Feeling good.   On Fosamax since 2019.   Received Covid vaccine.   PCP:  Laurann Montana, MD  Patient's last menstrual period was 10/13/2000 (within years).           Sexually active: Yes.    The current method of family planning is status post hysterectomy--ovaries remain.    Exercising: Yes.    walks 1 hour/day and gardening Smoker:  no  Health Maintenance: Pap: 05-09-19 Neg:Neg HR HPV, 02/26/18 Neg:Neg HR HPV, 08-21-14 Neg:Neg HR HPV History of abnormal Pap:  Yes, 2005 hx LEEP procedure--CIN II. Pap 2010 ASCUS and positive HR HPV. 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 Pap 2011 ASCUS and negative HR HPV. Pap 2012 ASCUS and negative HR HPV. Colposcopy 2011 VAIN I. MMG:  07-26-19 3D/Neg/density C/BiRads1 Colonoscopy: 2020 normal with [facility] per patient;next 10 years BMD: 04-12-18  Result :Osteoporosis of spine TDaP: PCP Gardasil:   no XLK:GMWNU Hep C:Never Screening Labs:     reports that she has never smoked. She has never used smokeless tobacco. She reports current alcohol use of about 7.0 standard drinks of alcohol per week. She reports that she does not use drugs.  Past Medical History:  Diagnosis Date  . 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 ectocervix margin  . Elevated LDL cholesterol level   . Osteoporosis 2019  . STD (sexually transmitted disease)    HPV  . VAIN I (vaginal intraepithelial neoplasia grade I) 2011    Past Surgical History:  Procedure Laterality Date  . ABDOMINAL HYSTERECTOMY  05-23-08   TVH, cystocele repair w/graft & cystoscopy.  Ovaries remain  . APPENDECTOMY  07-15-08  . CERVICAL BIOPSY  W/ LOOP ELECTRODE EXCISION  08-02-2004   CIN II, Pos. ectocervix margin  . VAIN I      Current Outpatient Medications  Medication Sig Dispense Refill  . alendronate (FOSAMAX) 70 MG tablet Take with a full glass of water on an empty stomach. 12 tablet 3  .  Cholecalciferol (VITAMIN D3) 125 MCG (5000 UT) TABS Take 1 tablet by mouth as needed.     No current facility-administered medications for this visit.    Family History  Problem Relation Age of Onset  . Hypertension Mother   . Stroke Father   . Hypertension Father   . Diabetes Brother   . Diabetes Brother     Review of Systems  All other systems reviewed and are negative.   Exam:   BP 122/68   Pulse 60   Resp 14   Ht 5\' 5"  (1.651 m)   Wt 129 lb (58.5 kg)   LMP 10/13/2000 (Within Years)   BMI 21.47 kg/m     General appearance: alert, cooperative and appears stated age Head: normocephalic, without obvious abnormality, atraumatic Neck: no adenopathy, supple, symmetrical, trachea midline and thyroid normal to inspection and palpation Lungs: clear to auscultation bilaterally Breasts: normal appearance, no masses or tenderness, No nipple retraction or dimpling, No nipple discharge or bleeding, No axillary adenopathy Heart: regular rate and rhythm Abdomen: soft, non-tender; no masses, no organomegaly Extremities: extremities normal, atraumatic, no cyanosis or edema Skin: skin color, texture, turgor normal. No rashes or lesions Lymph nodes: cervical, supraclavicular, and axillary nodes normal. Neurologic: grossly normal  Pelvic: External genitalia:  no lesions              No abnormal inguinal nodes palpated.  Urethra:  normal appearing urethra with no masses, tenderness or lesions              Bartholins and Skenes: normal                 Vagina: normal appearing vagina with normal color and discharge, no lesions              Cervix: absent              Pap taken: No. Bimanual Exam:  Uterus:  absent              Adnexa: no mass, fullness, tenderness              Rectal exam: Yes.  .  Confirms.              Anus:  normal sphincter tone, no lesions  Chaperone was present for exam.  Assessment:   Well woman visit with normal exam. Hx CIN II and VAIN I. Status  post LEEP. Status post hysterectomy. Benign cervix in 2009. Osteoporosis. On Fosamax.   Plan: Mammogram screening discussed. Self breast awareness reviewed. Pap and HR HPV 2023.  Guidelines for Calcium, Vitamin D, regular exercise program including cardiovascular and weight bearing exercise. Routine labs.  BMD ordered.  Refill of Fosamax for one year.  Follow up annually and prn.     After visit summary provided.

## 2020-06-04 NOTE — Patient Instructions (Signed)

## 2020-06-05 LAB — CBC
Hematocrit: 38.6 % (ref 34.0–46.6)
Hemoglobin: 13.5 g/dL (ref 11.1–15.9)
MCH: 34.3 pg — ABNORMAL HIGH (ref 26.6–33.0)
MCHC: 35 g/dL (ref 31.5–35.7)
MCV: 98 fL — ABNORMAL HIGH (ref 79–97)
Platelets: 272 10*3/uL (ref 150–450)
RBC: 3.94 x10E6/uL (ref 3.77–5.28)
RDW: 12.3 % (ref 11.7–15.4)
WBC: 4.2 10*3/uL (ref 3.4–10.8)

## 2020-06-05 LAB — COMPREHENSIVE METABOLIC PANEL
ALT: 30 IU/L (ref 0–32)
AST: 30 IU/L (ref 0–40)
Albumin/Globulin Ratio: 2.4 — ABNORMAL HIGH (ref 1.2–2.2)
Albumin: 4.7 g/dL (ref 3.8–4.8)
Alkaline Phosphatase: 47 IU/L — ABNORMAL LOW (ref 48–121)
BUN/Creatinine Ratio: 13 (ref 12–28)
BUN: 10 mg/dL (ref 8–27)
Bilirubin Total: 0.4 mg/dL (ref 0.0–1.2)
CO2: 24 mmol/L (ref 20–29)
Calcium: 9.6 mg/dL (ref 8.7–10.3)
Chloride: 101 mmol/L (ref 96–106)
Creatinine, Ser: 0.78 mg/dL (ref 0.57–1.00)
GFR calc Af Amer: 95 mL/min/{1.73_m2} (ref >59)
GFR calc non Af Amer: 82 mL/min/{1.73_m2} (ref >59)
Globulin, Total: 2 g/dL (ref 1.5–4.5)
Glucose: 89 mg/dL (ref 65–99)
Potassium: 4.7 mmol/L (ref 3.5–5.2)
Sodium: 139 mmol/L (ref 134–144)
Total Protein: 6.7 g/dL (ref 6.0–8.5)

## 2020-06-05 LAB — LIPID PANEL
Chol/HDL Ratio: 3 ratio (ref 0.0–4.4)
Cholesterol, Total: 288 mg/dL — ABNORMAL HIGH (ref 100–199)
HDL: 95 mg/dL (ref >39)
LDL Chol Calc (NIH): 177 mg/dL — ABNORMAL HIGH (ref 0–99)
Triglycerides: 96 mg/dL (ref 0–149)
VLDL Cholesterol Cal: 16 mg/dL (ref 5–40)

## 2020-06-05 LAB — VITAMIN D 25 HYDROXY (VIT D DEFICIENCY, FRACTURES): Vit D, 25-Hydroxy: 30.6 ng/mL (ref 30.0–100.0)

## 2020-06-11 ENCOUNTER — Other Ambulatory Visit: Payer: Self-pay | Admitting: Obstetrics and Gynecology

## 2020-06-11 DIAGNOSIS — Z1231 Encounter for screening mammogram for malignant neoplasm of breast: Secondary | ICD-10-CM

## 2020-07-26 ENCOUNTER — Ambulatory Visit
Admission: RE | Admit: 2020-07-26 | Discharge: 2020-07-26 | Disposition: A | Discharge status: Home / Self Care | Payer: Managed Care, Other (non HMO) | Source: Ambulatory Visit | Attending: Obstetrics and Gynecology | Admitting: Obstetrics and Gynecology

## 2020-07-26 ENCOUNTER — Other Ambulatory Visit: Payer: Self-pay

## 2020-07-26 DIAGNOSIS — Z1231 Encounter for screening mammogram for malignant neoplasm of breast: Secondary | ICD-10-CM

## 2020-09-18 ENCOUNTER — Ambulatory Visit
Admission: RE | Admit: 2020-09-18 | Discharge: 2020-09-18 | Disposition: A | Discharge status: Home / Self Care | Payer: Managed Care, Other (non HMO) | Source: Ambulatory Visit | Attending: Obstetrics and Gynecology | Admitting: Obstetrics and Gynecology

## 2020-09-18 ENCOUNTER — Other Ambulatory Visit: Payer: Self-pay

## 2020-09-18 DIAGNOSIS — Z8739 Personal history of other diseases of the musculoskeletal system and connective tissue: Secondary | ICD-10-CM

## 2021-07-03 ENCOUNTER — Ambulatory Visit (INDEPENDENT_AMBULATORY_CARE_PROVIDER_SITE_OTHER): Payer: Managed Care, Other (non HMO) | Admitting: Obstetrics and Gynecology

## 2021-07-03 ENCOUNTER — Other Ambulatory Visit: Payer: Self-pay | Admitting: Obstetrics and Gynecology

## 2021-07-03 ENCOUNTER — Encounter: Payer: Self-pay | Admitting: Obstetrics and Gynecology

## 2021-07-03 ENCOUNTER — Other Ambulatory Visit: Payer: Self-pay

## 2021-07-03 VITALS — BP 122/70 | HR 66 | Ht 64.75 in | Wt 130.0 lb

## 2021-07-03 DIAGNOSIS — Z1231 Encounter for screening mammogram for malignant neoplasm of breast: Secondary | ICD-10-CM

## 2021-07-03 DIAGNOSIS — Z01419 Encounter for gynecological examination (general) (routine) without abnormal findings: Secondary | ICD-10-CM | POA: Diagnosis not present

## 2021-07-03 MED ORDER — ALENDRONATE SODIUM 70 MG PO TABS: ORAL_TABLET | ORAL | 3 refills | Status: DC

## 2021-07-03 NOTE — Patient Instructions (Signed)

## 2021-07-03 NOTE — Progress Notes (Signed)
60 y.o. G57P3003 Married Caucasian female here for annual exam.    Taking Fosamax.   Mother passed at age 20.  She lived in [place].   PCP:  Dr. Aliene Beams  Patient's last menstrual period was 10/13/2000 (within years).           Sexually active: Yes.    The current method of family planning is status post hysterectomy.    Exercising: Yes.     walking Smoker:  no  Health Maintenance: Pap: 05-09-19 Neg:Neg HR HPV, 02/26/18 Neg:Neg HR HPV, 08-21-14 Neg:Neg HR HPV History of abnormal Pap:  yes,  2005 hx LEEP procedure--CIN II.  Pap 2010 ASCUS and positive HR HPV.  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 Pap 2011 ASCUS and negative HR HPV.  Pap 2012 ASCUS and negative HR HPV.  Colposcopy 2011 VAIN I. MMG: 07-26-20 3D/Neg/BiRads1 Colonoscopy: 2020 normal;next 10 years BMD: 09-18-20  Result :Osteoporosis of spine;osteopenia of hip TDaP:  PCP.  She will do with her PCP. Gardasil:   no HIV:no Hep C:no Screening Labs:  PCP. Shingrix:  completed.   reports that she has never smoked. She has never used smokeless tobacco. She reports that she does not currently use alcohol after a past usage of about 7.0 standard drinks per week. She reports that she does not use drugs.  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 ectocervix margin   Elevated LDL cholesterol level    Hypertension    Osteoporosis 2019   STD (sexually transmitted disease)    HPV   VAIN I (vaginal intraepithelial neoplasia grade I) 2011    Past Surgical History:  Procedure Laterality Date   ABDOMINAL HYSTERECTOMY  05-23-08   TVH, cystocele repair w/graft & cystoscopy.  Ovaries remain   APPENDECTOMY  07-15-08   CERVICAL BIOPSY  W/ LOOP ELECTRODE EXCISION  08-02-2004   CIN II, Pos. ectocervix margin   VAIN I      Current Outpatient Medications  Medication Sig Dispense Refill   alendronate (FOSAMAX) 70 MG tablet Take with a full glass of water on an empty stomach. 12 tablet  3   amLODipine (NORVASC) 5 MG tablet Take 5 mg by mouth daily.     atorvastatin (LIPITOR) 20 MG tablet Take 20 mg by mouth every evening.     Cholecalciferol (VITAMIN D3) 125 MCG (5000 UT) TABS Take 1 tablet by mouth as needed.     No current facility-administered medications for this visit.    Family History  Problem Relation Age of Onset   Hypertension Mother    Stroke Father    Hypertension Father    Diabetes Brother    Diabetes Brother    Breast cancer Neg Hx     Review of Systems  All other systems reviewed and are negative.  Exam:   BP 122/70   Pulse 66   Ht 5' 4.75" (1.645 m)   Wt 130 lb (59 kg)   LMP 10/13/2000 (Within Years)   SpO2 100%   BMI 21.80 kg/m     General appearance: alert, cooperative and appears stated age Head: normocephalic, without obvious abnormality, atraumatic Neck: no adenopathy, supple, symmetrical, trachea midline and thyroid normal to inspection and palpation Lungs: clear to auscultation bilaterally Breasts: normal appearance, no masses or tenderness, No nipple retraction or dimpling, No nipple discharge or bleeding, No axillary adenopathy Heart: regular rate and rhythm Abdomen: soft, non-tender; no masses, no organomegaly Extremities: extremities normal, atraumatic, no  cyanosis or edema Skin: skin color, texture, turgor normal. No rashes or lesions Lymph nodes: cervical, supraclavicular, and axillary nodes normal. Neurologic: grossly normal  Pelvic: External genitalia:  no lesions              No abnormal inguinal nodes palpated.              Urethra:  normal appearing urethra with no masses, tenderness or lesions              Bartholins and Skenes: normal                 Vagina: normal appearing vagina with normal color and discharge, no lesions              Cervix:  absent              Pap taken: no Bimanual Exam:  Uterus:  absent              Adnexa: no mass, fullness, tenderness              Rectal exam: yes.  Confirms.               Anus:  normal sphincter tone, no lesions  Chaperone was present for exam:  Marchelle Folks, CMA  Assessment:   Well woman visit with gynecologic exam. Hx CIN II and VAIN I. Status post LEEP. Status post hysterectomy.  Benign cervix in 2009. Osteoporosis. On Fosamax.   Plan: Mammogram screening discussed. Self breast awareness reviewed. Pap and HR HPV 2023.  Guidelines for Calcium, Vitamin D, regular exercise program including cardiovascular and weight bearing exercise. Refill of Fosamax for one year.  BMD in Dec. 2023. Follow up annually and prn.    After visit summary provided.

## 2021-08-05 ENCOUNTER — Ambulatory Visit
Admission: RE | Admit: 2021-08-05 | Discharge: 2021-08-05 | Disposition: A | Discharge status: Home / Self Care | Payer: Managed Care, Other (non HMO) | Source: Ambulatory Visit | Attending: Obstetrics and Gynecology | Admitting: Obstetrics and Gynecology

## 2021-08-05 ENCOUNTER — Other Ambulatory Visit: Payer: Self-pay

## 2021-08-05 DIAGNOSIS — Z1231 Encounter for screening mammogram for malignant neoplasm of breast: Secondary | ICD-10-CM

## 2021-11-19 IMAGING — MG MM DIGITAL SCREENING BILAT W/ TOMO AND CAD
8 series · 9 of 24 positions shown · non-contrast
Comparison: Previous exam(s).

CLINICAL DATA: Screening.

EXAM:
DIGITAL SCREENING BILATERAL MAMMOGRAM WITH TOMOSYNTHESIS AND CAD
TECHNIQUE: Bilateral screening digital craniocaudal and mediolateral oblique
mammograms were obtained. Bilateral screening digital breast
tomosynthesis was performed. The images were evaluated with
computer-aided detection.

[L MLO synth-2D]
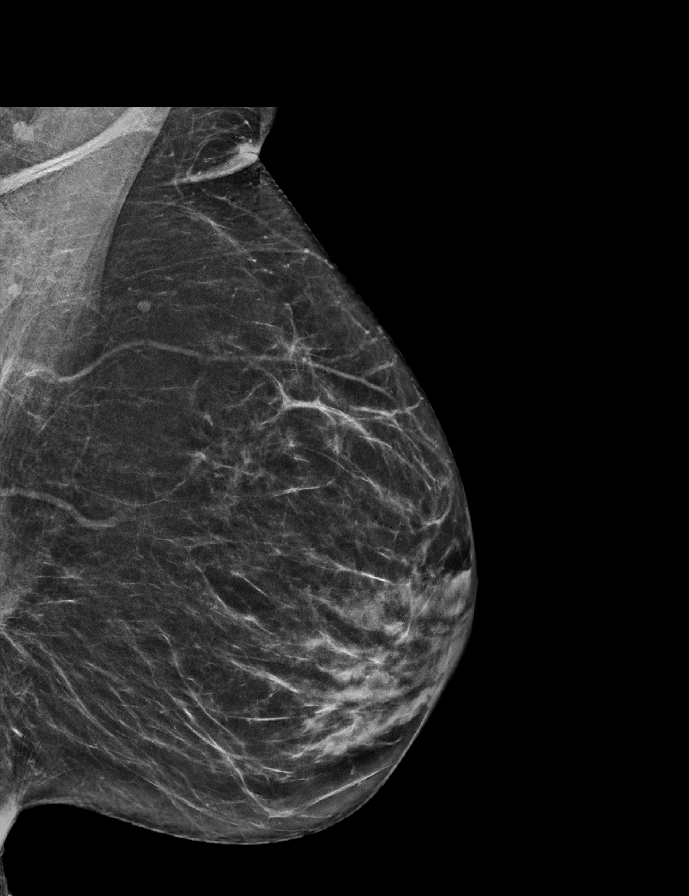

[R CC synth-2D]
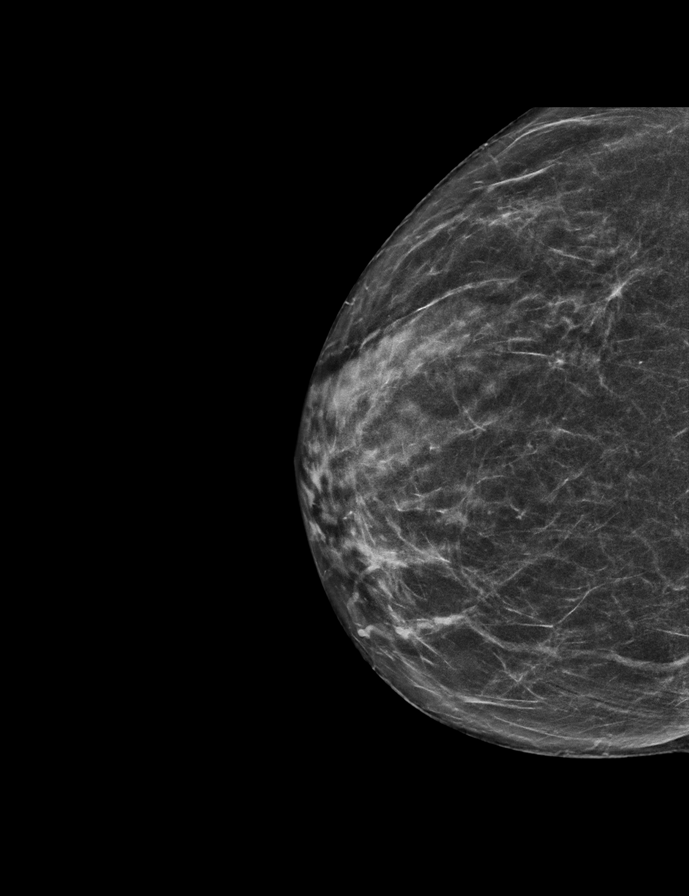

[L CC synth-2D]
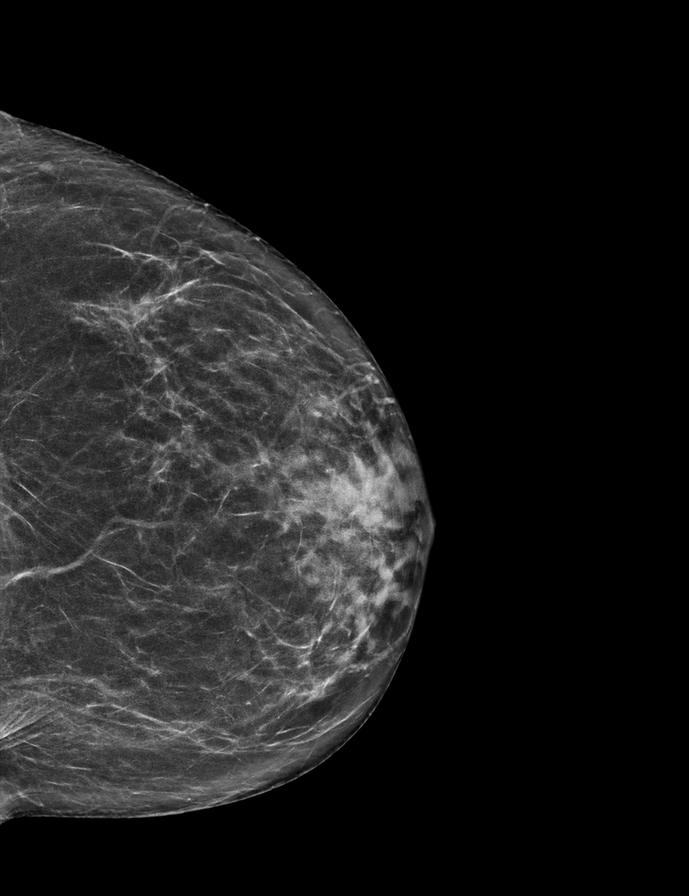

[R MLO synth-2D]
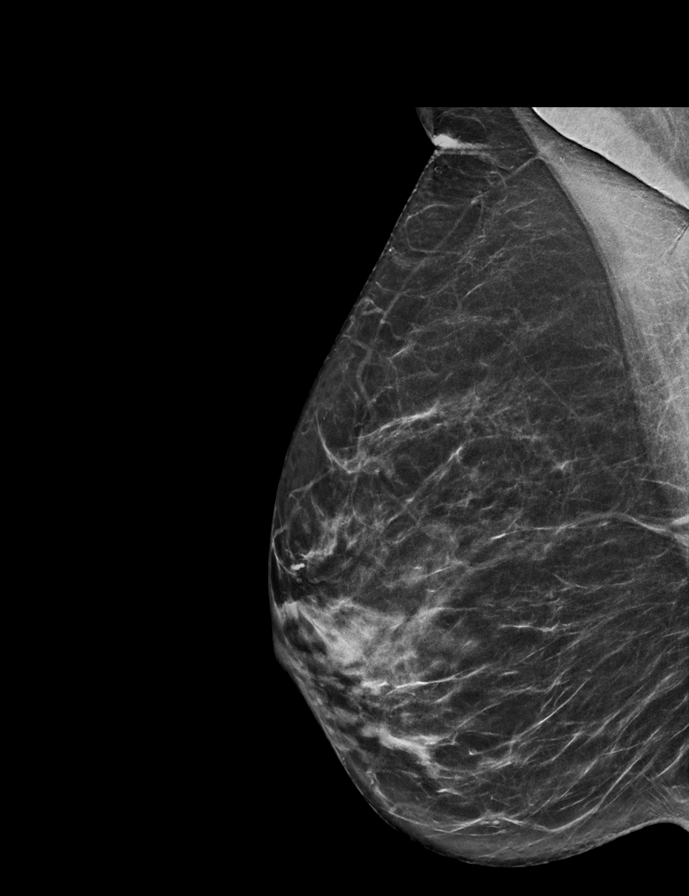

[L CC tomo · 2 of 58 frames shown]
[frame 19/58]
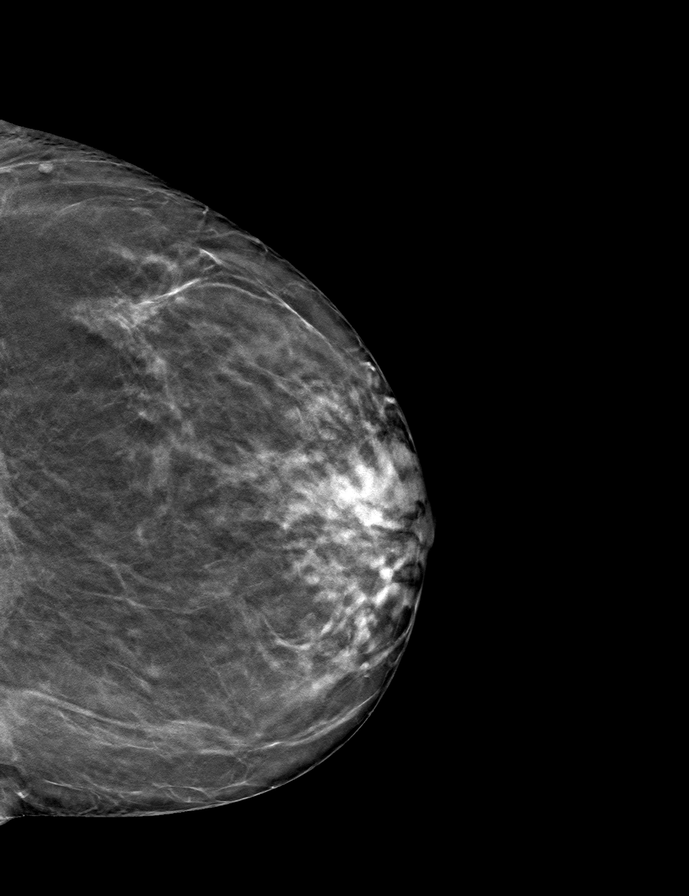
[frame 29/58]
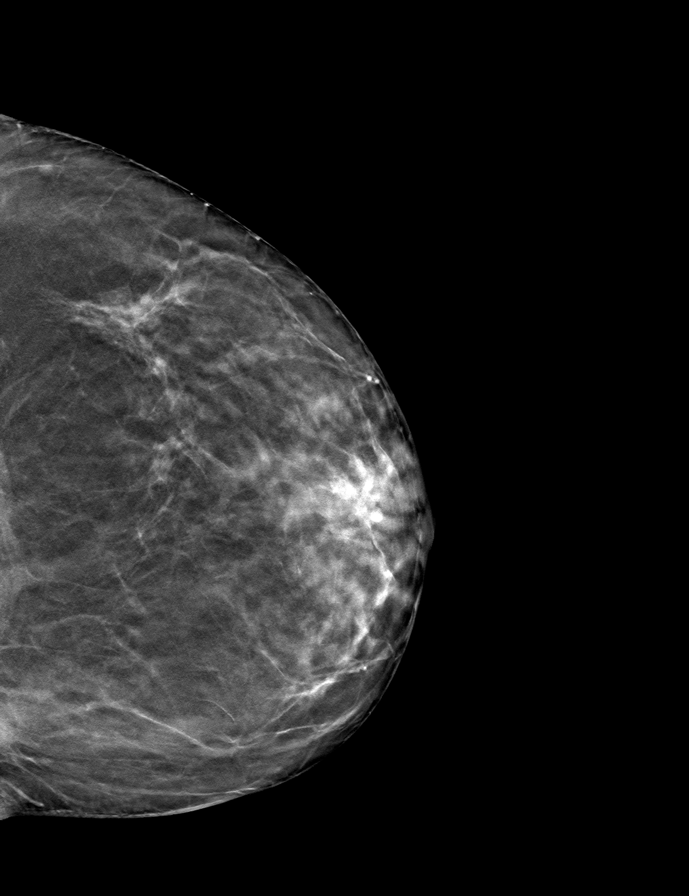

[L MLO tomo · tomo slice 31/60.0]
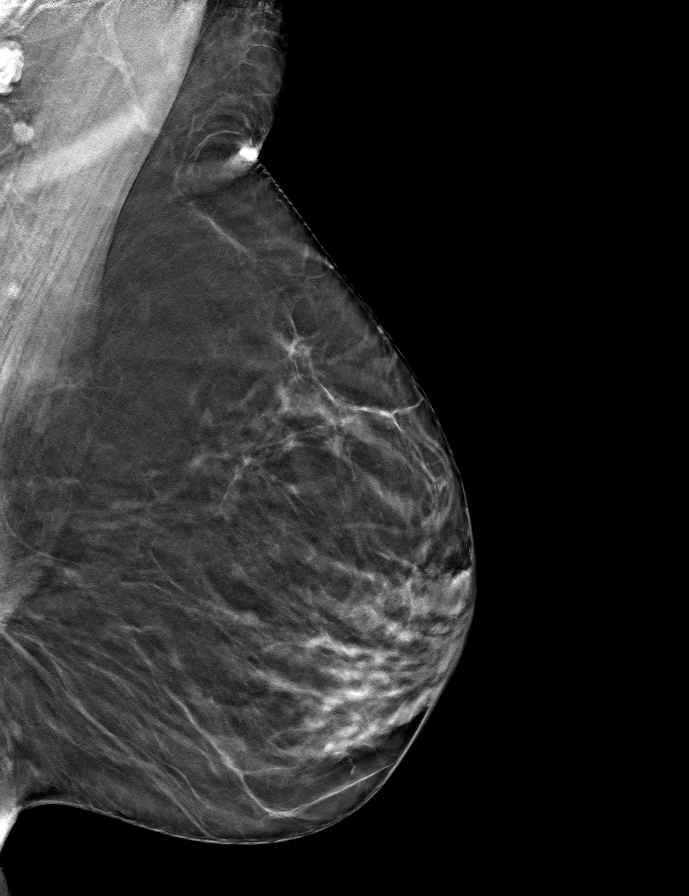

[R MLO tomo · tomo slice 31/60.0]
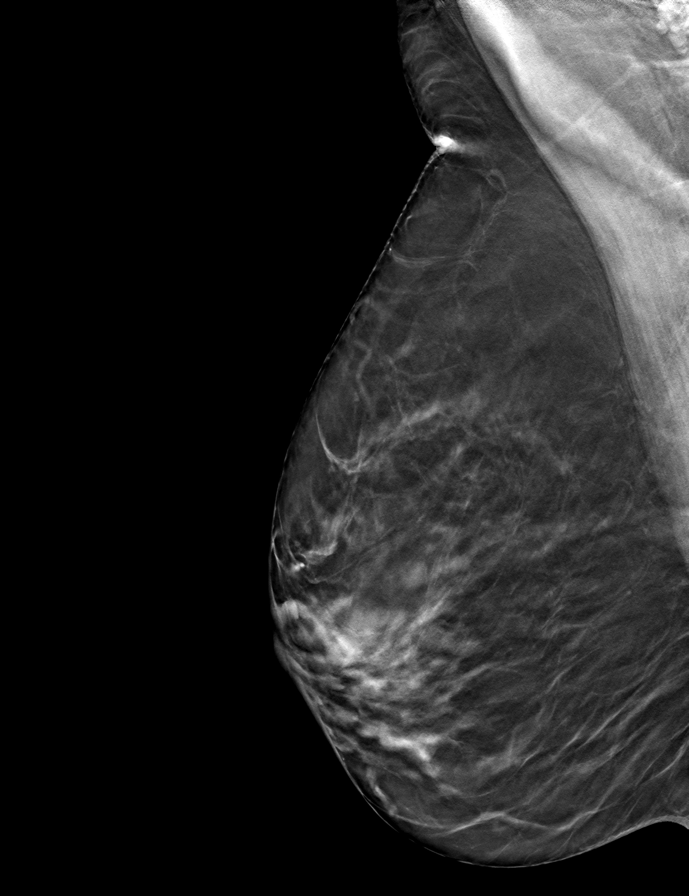

[R CC tomo · tomo slice 28/55.0]
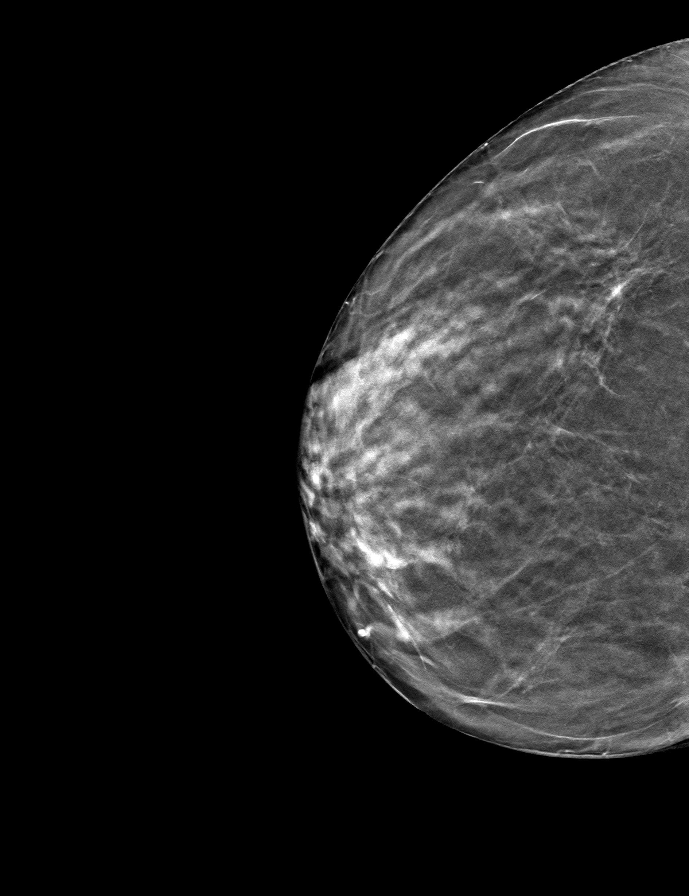

[9 of 24 positions shown; findings below may reference images not displayed]

ACR Breast Density Category c: The breast tissue is heterogeneously
dense, which may obscure small masses.
FINDINGS: There are no findings suspicious for malignancy.
IMPRESSION: No mammographic evidence of malignancy. A result letter of this
screening mammogram will be mailed directly to the patient.

RECOMMENDATION:
Screening mammogram in one year. (Code:Q3-[id number])

BI-RADS CATEGORY  1: Negative.

## 2022-06-15 ENCOUNTER — Other Ambulatory Visit: Payer: Self-pay | Admitting: Obstetrics and Gynecology

## 2022-06-18 NOTE — Telephone Encounter (Signed)
Last AEX 07/03/2021--scheduled for 07/08/2022. Last DEXA 09/18/2020-osteoporosis (T-score -2.5)

## 2022-07-02 ENCOUNTER — Other Ambulatory Visit: Payer: Self-pay | Admitting: Obstetrics and Gynecology

## 2022-07-02 DIAGNOSIS — Z1231 Encounter for screening mammogram for malignant neoplasm of breast: Secondary | ICD-10-CM

## 2022-07-07 NOTE — Progress Notes (Unsigned)
61 y.o. G16P3003 Married Caucasian female here for annual exam.    PCP:     Patient's last menstrual period was 10/13/2000 (within years).           Sexually active: {yes no:314532}  The current method of family planning is status post hysterectomy.    Exercising: {yes no:314532}  {types:19826} Smoker:  no  Health Maintenance: Pap:  05-09-19 Neg:Neg HR HPV, 02/26/18 Neg:Neg HR HPV, 08-21-14 Neg:Neg HR HPV History of abnormal Pap:  yes,  2005 hx LEEP procedure--CIN II.  Pap 2010 ASCUS and positive HR HPV.  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 Pap 2011 ASCUS and negative HR HPV.  Pap 2012 ASCUS and negative HR HPV.  Colposcopy 2011 VAIN I. MMG:  08-05-21 Neg/BiRads1 Colonoscopy:  2020 normal;next 10 years BMD: 09-18-20  Result :Osteoporosis TDaP:  PCP Gardasil:   no HIV: no Hep C: no Screening Labs:  Hb today: ***, Urine today: ***   reports that she has never smoked. She has never used smokeless tobacco. She reports that she does not currently use alcohol after a past usage of about 7.0 standard drinks of alcohol per week. She reports that she does not use drugs.  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 ectocervix margin   Elevated LDL cholesterol level    Hypertension    Osteoporosis 2019   STD (sexually transmitted disease)    HPV   VAIN I (vaginal intraepithelial neoplasia grade I) 2011    Past Surgical History:  Procedure Laterality Date   ABDOMINAL HYSTERECTOMY  05-23-08   TVH, cystocele repair w/graft & cystoscopy.  Ovaries remain   APPENDECTOMY  07-15-08   CERVICAL BIOPSY  W/ LOOP ELECTRODE EXCISION  08-02-2004   CIN II, Pos. ectocervix margin   VAIN I      Current Outpatient Medications  Medication Sig Dispense Refill   alendronate (FOSAMAX) 70 MG tablet TAKE ONE TABLET BY MOUTH WEEKLY. TAKE WITH A FULL GLASS OF WATER ON AN EMPTY STOMACH. 4 tablet 0   amLODipine (NORVASC) 5 MG tablet Take 5 mg by mouth daily.      atorvastatin (LIPITOR) 20 MG tablet Take 20 mg by mouth every evening.     Cholecalciferol (VITAMIN D3) 125 MCG (5000 UT) TABS Take 1 tablet by mouth as needed.     No current facility-administered medications for this visit.    Family History  Problem Relation Age of Onset   Hypertension Mother    Stroke Father    Hypertension Father    Diabetes Brother    Diabetes Brother    Breast cancer Neg Hx     Review of Systems  Exam:   LMP 10/13/2000 (Within Years)     General appearance: alert, cooperative and appears stated age Head: normocephalic, without obvious abnormality, atraumatic Neck: no adenopathy, supple, symmetrical, trachea midline and thyroid normal to inspection and palpation Lungs: clear to auscultation bilaterally Breasts: normal appearance, no masses or tenderness, No nipple retraction or dimpling, No nipple discharge or bleeding, No axillary adenopathy Heart: regular rate and rhythm Abdomen: soft, non-tender; no masses, no organomegaly Extremities: extremities normal, atraumatic, no cyanosis or edema Skin: skin color, texture, turgor normal. No rashes or lesions Lymph nodes: cervical, supraclavicular, and axillary nodes normal. Neurologic: grossly normal  Pelvic: External genitalia:  no lesions              No abnormal inguinal nodes palpated.  Urethra:  normal appearing urethra with no masses, tenderness or lesions              Bartholins and Skenes: normal                 Vagina: normal appearing vagina with normal color and discharge, no lesions              Cervix: no lesions              Pap taken: {yes no:314532} Bimanual Exam:  Uterus:  normal size, contour, position, consistency, mobility, non-tender              Adnexa: no mass, fullness, tenderness              Rectal exam: {yes no:314532}.  Confirms.              Anus:  normal sphincter tone, no lesions  Chaperone was present for exam:  ***  Assessment:   Well woman visit with  gynecologic exam.   Plan: Mammogram screening discussed. Self breast awareness reviewed. Pap and HR HPV as above. Guidelines for Calcium, Vitamin D, regular exercise program including cardiovascular and weight bearing exercise.   Follow up annually and prn.   Additional counseling given.  {yes [id number]. _______ minutes face to face time of which over 50% was spent in counseling.    After visit summary provided.

## 2022-07-08 ENCOUNTER — Encounter: Payer: Self-pay | Admitting: Obstetrics and Gynecology

## 2022-07-08 ENCOUNTER — Ambulatory Visit (INDEPENDENT_AMBULATORY_CARE_PROVIDER_SITE_OTHER): Payer: Managed Care, Other (non HMO) | Admitting: Obstetrics and Gynecology

## 2022-07-08 ENCOUNTER — Other Ambulatory Visit (HOSPITAL_COMMUNITY)
Admission: RE | Admit: 2022-07-08 | Discharge: 2022-07-08 | Disposition: A | Discharge status: Home / Self Care | Payer: Managed Care, Other (non HMO) | Source: Ambulatory Visit | Attending: Obstetrics and Gynecology | Admitting: Obstetrics and Gynecology

## 2022-07-08 VITALS — BP 122/64 | HR 83 | Ht 64.5 in | Wt 132.0 lb

## 2022-07-08 DIAGNOSIS — Z01419 Encounter for gynecological examination (general) (routine) without abnormal findings: Secondary | ICD-10-CM

## 2022-07-08 DIAGNOSIS — Z1272 Encounter for screening for malignant neoplasm of vagina: Secondary | ICD-10-CM | POA: Diagnosis present

## 2022-07-08 DIAGNOSIS — M810 Age-related osteoporosis without current pathological fracture: Secondary | ICD-10-CM | POA: Diagnosis not present

## 2022-07-08 DIAGNOSIS — Z87410 Personal history of cervical dysplasia: Secondary | ICD-10-CM | POA: Diagnosis present

## 2022-07-08 DIAGNOSIS — Z87411 Personal history of vaginal dysplasia: Secondary | ICD-10-CM | POA: Insufficient documentation

## 2022-07-08 MED ORDER — ALENDRONATE SODIUM 70 MG PO TABS: ORAL_TABLET | ORAL | 3 refills | Status: DC

## 2022-07-08 NOTE — Patient Instructions (Signed)

## 2022-07-23 LAB — CYTOLOGY - PAP
Comment: NEGATIVE
Diagnosis: NEGATIVE
High risk HPV: NEGATIVE

## 2022-08-06 ENCOUNTER — Ambulatory Visit
Admission: RE | Admit: 2022-08-06 | Discharge: 2022-08-06 | Disposition: A | Discharge status: Home / Self Care | Payer: Managed Care, Other (non HMO) | Source: Ambulatory Visit | Attending: Obstetrics and Gynecology | Admitting: Obstetrics and Gynecology

## 2022-08-06 DIAGNOSIS — Z1231 Encounter for screening mammogram for malignant neoplasm of breast: Secondary | ICD-10-CM

## 2022-09-11 ENCOUNTER — Other Ambulatory Visit: Payer: Self-pay | Admitting: Obstetrics and Gynecology

## 2022-09-12 NOTE — Telephone Encounter (Signed)
FYI. Rx sent on 07/08/2022 for #12 w/ 3 refills.  Called pharmacy to confirm that they received new Rx and they have it so will refuse this request for refill on old Rx.

## 2023-02-02 ENCOUNTER — Encounter: Payer: Self-pay | Admitting: Obstetrics and Gynecology

## 2023-02-02 ENCOUNTER — Ambulatory Visit
Admission: RE | Admit: 2023-02-02 | Discharge: 2023-02-02 | Disposition: A | Discharge status: Home / Self Care | Payer: Managed Care, Other (non HMO) | Source: Ambulatory Visit | Attending: Obstetrics and Gynecology | Admitting: Obstetrics and Gynecology

## 2023-02-02 DIAGNOSIS — M810 Age-related osteoporosis without current pathological fracture: Secondary | ICD-10-CM

## 2023-02-09 NOTE — Progress Notes (Unsigned)
GYNECOLOGY  VISIT   HPI: 62 y.o.   Married  Caucasian  female   [phone] with Patient's last menstrual period was 10/13/2000 (within years).   here for   discuss dexa  Has been on Foxamax for 5 years as of 2024.  BMD worsened since last study in 2021.   Study Result  Narrative & Impression  EXAM: DUAL X-RAY ABSORPTIOMETRY (DXA) FOR BONE MINERAL DENSITY   IMPRESSION: Referring Physician:  Patton Salles Your patient completed a bone mineral density test using GE Lunar iDXA system (analysis version: 16). Technologist: KAT PATIENT: Name: Monique Brown, Monique Brown Patient ID:  [id number]   Birth Date: 20-Dec-1960 Height:     65.0 in. Sex:         Female   Measured:   02/02/2023 Weight:     133.2 lbs. Indications: Caucasian, Estrogen Deficient, Height Loss (781.91), History of Osteoporosis, Hysterectomy, Postmenopausal Fractures: None Treatments: Calcium (E943.0), Fosamax, Vitamin D (E933.5)   ASSESSMENT: The BMD measured at AP Spine L1-L4 is 0.873 g/cm2 with a T-score of -2.6. This patient is considered osteoporotic according to World Health Organization [facility]) criteria.   The quality of the exam is good.   Site Region Measured Date Measured Age YA BMD Significant CHANGE T-score AP Spine  L1-L4      02/02/2023    63.8         -2.6    0.873 g/cm2 AP Spine L1-L4 09/18/2020 61.4 -2.5 0.879 g/cm2 *   DualFemur Neck Left  02/02/2023    63.8         -2.0    0.757 g/cm2 DualFemur Neck Left 09/18/2020 61.4 -1.9 0.771 g/cm2 *   DualFemur Total Mean 02/02/2023    63.8         -1.0    0.877 g/cm2 DualFemur Total Mean 09/18/2020 61.4 -1.1 0.863 g/cm2 *   World Health Organization [facility]) criteria for post-menopausal, Caucasian Women: Normal       T-score at or above -1 SD Osteopenia   T-score between -1 and -2.5 SD Osteoporosis T-score at or below -2.5 SD   RECOMMENDATION: 1. All patients should optimize calcium and vitamin D intake. 2. Consider FDA-approved medical  therapies in postmenopausal women and men aged 65 years and older, based on the following: a. A hip or vertebral (clinical or morphometric) fracture. b. T-score = -2.5 at the femoral neck or spine after appropriate evaluation to exclude secondary causes. c. Low bone mass (T-score between -1.0 and -2.5 at the femoral neck or spine) and a 10-year probability of a hip fracture = 3% or a 10-year probability of a major osteoporosis-related fracture = 20% based on the [place]-adapted WHO algorithm. d. Clinician judgment and/or patient preferences may indicate treatment for people with 10-year fracture probabilities above or below these levels.   FOLLOW-UP: Patients with diagnosis of osteoporosis or at high risk for fracture should have regular bone mineral density tests. Patients eligible for Medicare are allowed routine testing every 2 years. The testing frequency can be increased to one year for patients who have rapidly progressing disease, are receiving or discontinuing medical therapy to restore bone mass, or have additional risk factors.   I have reviewed this study and agree with the findings. [facility]     Electronically Signed   By: Frederico Hamman M.D.   On: 02/02/2023 11:51    PCP - Dr. Tracie Harrier, Deboraha Sprang.   GYNECOLOGIC HISTORY: Patient's last menstrual period was 10/13/2000 (within years). Contraception:  hysterectomy Menopausal hormone therapy:  n/a Last mammogram:  08/06/22 Breast Density B, BI-RADS CAT 1 neg Last pap smear:   07/08/22 neg: HR HPV neg, 05-09-19 Neg:Neg HR HPV, 02/26/18 Neg:Neg HR HPV, 08-21-14 Neg:Neg HR HPV         OB History     Gravida  3   Para  3   Term  3   Preterm      AB      Living  3      SAB      IAB      Ectopic      Multiple      Live Births                 Patient Active Problem List   Diagnosis Date Noted   Osteoporosis 04/27/2018    Past Medical History:  Diagnosis Date   CIN II (cervical  intraepithelial neoplasia II) 07/2004   -Pos. ectocervix margin   Elevated LDL cholesterol level    Hypertension    Osteoporosis 2019   STD (sexually transmitted disease)    HPV   VAIN I (vaginal intraepithelial neoplasia grade I) 2011    Past Surgical History:  Procedure Laterality Date   ABDOMINAL HYSTERECTOMY  05-23-08   TVH, cystocele repair w/graft & cystoscopy.  Ovaries remain   APPENDECTOMY  07-15-08   CERVICAL BIOPSY  W/ LOOP ELECTRODE EXCISION  08-02-2004   CIN II, Pos. ectocervix margin   VAIN I      Current Outpatient Medications  Medication Sig Dispense Refill   alendronate (FOSAMAX) 70 MG tablet TAKE ONE TABLET BY MOUTH WEEKLY. TAKE WITH A FULL GLASS OF WATER ON AN EMPTY STOMACH. 12 tablet 3   amLODipine (NORVASC) 5 MG tablet Take 5 mg by mouth daily.     atorvastatin (LIPITOR) 20 MG tablet Take 20 mg by mouth every evening.     Calcium Carb-Cholecalciferol (CALCIUM 500 +D) 500-10 MG-MCG TABS Take by mouth.     Cholecalciferol (VITAMIN D3) 125 MCG (5000 UT) TABS Take 1 tablet by mouth as needed.     No current facility-administered medications for this visit.     ALLERGIES: Patient has no known allergies.  Family History  Problem Relation Age of Onset   Hypertension Mother    Stroke Father    Hypertension Father    Diabetes Brother    Diabetes Brother    Breast cancer Neg Hx     Social History   Socioeconomic History   Marital status: Married    Spouse name: Not on file   Number of children: Not on file   Years of education: Not on file   Highest education level: Not on file  Occupational History   Not on file  Tobacco Use   Smoking status: Never   Smokeless tobacco: Never  Vaping Use   Vaping Use: Never used  Substance and Sexual Activity   Alcohol use: Yes    Alcohol/week: 7.0 standard drinks of alcohol    Types: 7 Glasses of wine per week    Comment: 7 glasses of wine per week   Drug use: No   Sexual activity: Yes    Partners: Male     Birth control/protection: Surgical    Comment: TVH--still has ovaries/first intercourse >14, more than 5 partners  Other Topics Concern   Not on file  Social History Narrative   Not on file   Social Determinants of Health   Financial Resource Strain:  Not on file  Food Insecurity: Not on file  Transportation Needs: Not on file  Physical Activity: Not on file  Stress: Not on file  Social Connections: Not on file  Intimate Partner Violence: Not on file    Review of Systems  All other systems reviewed and are negative.   PHYSICAL EXAMINATION:    BP 124/80 (BP Location: Right Arm, Patient Position: Sitting, Cuff Size: Normal)   Pulse 64   Ht 5\' 5"  (1.651 m)   Wt 129 lb (58.5 kg)   LMP 10/13/2000 (Within Years)   SpO2 99%   BMI 21.47 kg/m     General appearance: alert, cooperative and appears stated age  ASSESSMENT  Osteoporosis.   Decreased BMD of hip and spine.  Status post Fosamax x 5 years.    PLAN  We reviewed options for care:  continuation of Fosamax or switch to Prolia or Reclast.  We discussed risks and benefits of each options.  Continue Fosamax until starting Prolia, which will need precert.  Will check BMP with GFR and vit D level.  BMD in 2 years.    24 min  total time was spent for this patient encounter, including preparation, face-to-face counseling with the patient, coordination of care, and documentation of the encounter.

## 2023-02-23 ENCOUNTER — Encounter: Payer: Self-pay | Admitting: Obstetrics and Gynecology

## 2023-02-23 ENCOUNTER — Ambulatory Visit (INDEPENDENT_AMBULATORY_CARE_PROVIDER_SITE_OTHER): Payer: Managed Care, Other (non HMO) | Admitting: Obstetrics and Gynecology

## 2023-02-23 VITALS — BP 124/80 | HR 64 | Ht 65.0 in | Wt 129.0 lb

## 2023-02-23 DIAGNOSIS — M810 Age-related osteoporosis without current pathological fracture: Secondary | ICD-10-CM

## 2023-02-23 NOTE — Patient Instructions (Signed)
Denosumab Injection (Osteoporosis) What is this medication? DENOSUMAB (den oh SUE mab) prevents and treats osteoporosis. It works by making your bones stronger and less likely to break (fracture). It is a monoclonal antibody. This medicine may be used for other purposes; ask your health care provider or pharmacist if you have questions. COMMON BRAND NAME(S): Prolia What should I tell my care team before I take this medication? They need to know if you have any of these conditions: Dental or gum disease, or plan to have dental surgery or a tooth pulled Infection Kidney disease Low levels of calcium or vitamin D in your blood On dialysis Poor nutrition Skin conditions Thyroid disease, or have had thyroid or parathyroid surgery Trouble absorbing minerals in your stomach or intestine An unusual or allergic reaction to denosumab, other medications, foods, dyes, or preservatives Pregnant or trying to get pregnant Breastfeeding How should I use this medication? This medication is injected under the skin. It is given by your care team in a hospital or clinic setting. A special MedGuide will be given to you before each treatment. Be sure to read this information carefully each time. Talk to your care team about the use of this medication in children. Special care may be needed. Overdosage: If you think you have taken too much of this medicine contact a poison control center or emergency room at once. NOTE: This medicine is only for you. Do not share this medicine with others. What if I miss a dose? Keep appointments for follow-up doses. It is important not to miss your dose. Call your care team if you are unable to keep an appointment. What may interact with this medication? Do not take this medication with any of the following: Other medications that contain denosumab This medication may also interact with the following: Medications that lower your chance of fighting infection Steroid  medications, such as prednisone or cortisone This list may not describe all possible interactions. Give your health care provider a list of all the medicines, herbs, non-prescription drugs, or dietary supplements you use. Also tell them if you smoke, drink alcohol, or use illegal drugs. Some items may interact with your medicine. What should I watch for while using this medication? Your condition will be monitored carefully while you are receiving this medication. You may need blood work while taking this medication. This medication may increase your risk of getting an infection. Call your care team for advice if you get a fever, chills, sore throat, or other symptoms of a cold or flu. Do not treat yourself. Try to avoid being around people who are sick. Tell your dentist and dental surgeon that you are taking this medication. You should not have major dental surgery while on this medication. See your dentist to have a dental exam and fix any dental problems before starting this medication. Take good care of your teeth while on this medication. Make sure you see your dentist for regular follow-up appointments. You should make sure you get enough calcium and vitamin D while you are taking this medication. Discuss the foods you eat and the vitamins you take with your care team. Talk to your care team if you are pregnant or think you might be pregnant. This medication can cause serious birth defects if taken during pregnancy and for 5 months after the last dose. You will need a negative pregnancy test before starting this medication. Contraception is recommended while taking this medication and for 5 months after the last dose. Your care   team can help you find the option that works for you. Talk to your care team before breastfeeding. Changes to your treatment plan may be needed. What side effects may I notice from receiving this medication? Side effects that you should report to your care team as soon as  possible: Allergic reactions--skin rash, itching, hives, swelling of the face, lips, tongue, or throat Infection--fever, chills, cough, sore throat, wounds that don't heal, pain or trouble when passing urine, general feeling of discomfort or being unwell Low calcium level--muscle pain or cramps, confusion, tingling, or numbness in the hands or feet Osteonecrosis of the jaw--pain, swelling, or redness in the mouth, numbness of the jaw, poor healing after dental work, unusual discharge from the mouth, visible bones in the mouth Severe bone, joint, or muscle pain Skin infection--skin redness, swelling, warmth, or pain Side effects that usually do not require medical attention (report these to your care team if they continue or are bothersome): Back pain Headache Joint pain Muscle pain Pain in the hands, arms, legs, or feet Runny or stuffy nose Sore throat This list may not describe all possible side effects. Call your doctor for medical advice about side effects. You may report side effects to FDA at 1-800-FDA-1088. Where should I keep my medication? This medication is given in a hospital or clinic. It will not be stored at home. NOTE: This sheet is a summary. It may not cover all possible information. If you have questions about this medicine, talk to your doctor, pharmacist, or health care provider.  2023 Elsevier/Gold Standard (2022-02-10 00:00:00)   Zoledronic Acid Injection (Bone Disorders) What is this medication? ZOLEDRONIC ACID (ZOE le dron ik AS id) prevents and treats osteoporosis. It may also be used to treat Paget's disease of the bone. It works by Paramedic stronger and less likely to break (fracture). It belongs to a group of medications called bisphosphonates. This medicine may be used for other purposes; ask your health care provider or pharmacist if you have questions. COMMON BRAND NAME(S): Reclast What should I tell my care team before I take this medication? They  need to know if you have any of these conditions: Bleeding disorder Cancer Dental disease Kidney disease Low levels of calcium in the blood Low red blood cell counts Lung or breathing disease, such as asthma Receiving steroids, such as dexamethasone or prednisone An unusual or allergic reaction to zoledronic acid, other medications, foods, dyes, or preservatives Pregnant or trying to get pregnant Breast-feeding How should I use this medication? This medication is injected into a vein. It is given by your care team in a hospital or clinic setting. A special MedGuide will be given to you before each treatment. Be sure to read this information carefully each time. Talk to your care team about the use of this medication in children. Special care may be needed. Overdosage: If you think you have taken too much of this medicine contact a poison control center or emergency room at once. NOTE: This medicine is only for you. Do not share this medicine with others. What if I miss a dose? Keep appointments for follow-up doses. It is important not to miss your dose. Call your care team if you are unable to keep an appointment. What may interact with this medication? Certain antibiotics given by injection Medications for pain and inflammation, such as ibuprofen, naproxen, NSAIDs Some diuretics, such as bumetanide, furosemide Teriparatide This list may not describe all possible interactions. Give your health care provider a  list of all the medicines, herbs, non-prescription drugs, or dietary supplements you use. Also tell them if you smoke, drink alcohol, or use illegal drugs. Some items may interact with your medicine. What should I watch for while using this medication? Visit your care team for regular checks on your progress. It may be some time before you see the benefit from this medication. Some people who take this medication have severe bone, joint, or muscle pain. This medication may also  increase your risk for jaw problems or a broken thigh bone. Tell your care team right away if you have severe pain in your jaw, bones, joints, or muscles. Tell your care team if you have any pain that does not go away or that gets worse. You should make sure you get enough calcium and vitamin D while you are taking this medication. Discuss the foods you eat and the vitamins you take with your care team. You may need bloodwork while taking this medication. Tell your dentist and dental surgeon that you are taking this medication. You should not have major dental surgery while on this medication. See your dentist to have a dental exam and fix any dental problems before starting this medication. Take good care of your teeth while on this medication. Make sure you see your dentist for regular follow-up appointments. What side effects may I notice from receiving this medication? Side effects that you should report to your care team as soon as possible: Allergic reactions--skin rash, itching, hives, swelling of the face, lips, tongue, or throat Kidney injury--decrease in the amount of urine, swelling of the ankles, hands, or feet Low calcium level--muscle pain or cramps, confusion, tingling, or numbness in the hands or feet Osteonecrosis of the jaw--pain, swelling, or redness in the mouth, numbness of the jaw, poor healing after dental work, unusual discharge from the mouth, visible bones in the mouth Severe bone, joint, or muscle pain Side effects that usually do not require medical attention (report to your care team if they continue or are bothersome): Diarrhea Dizziness Headache Nausea Stomach pain Vomiting This list may not describe all possible side effects. Call your doctor for medical advice about side effects. You may report side effects to FDA at 1-800-FDA-1088. Where should I keep my medication? This medication is given in a hospital or clinic. It will not be stored at home. NOTE: This sheet  is a summary. It may not cover all possible information. If you have questions about this medicine, talk to your doctor, pharmacist, or health care provider.  2023 Elsevier/Gold Standard (2021-11-15 00:00:00)

## 2023-02-24 LAB — BASIC METABOLIC PANEL WITH GFR
BUN: 14 mg/dL (ref 7–25)
CO2: 25 mmol/L (ref 20–32)
Calcium: 10.2 mg/dL (ref 8.6–10.4)
Chloride: 99 mmol/L (ref 98–110)
Creat: 0.79 mg/dL (ref 0.50–1.05)
Glucose, Bld: 86 mg/dL (ref 65–99)
Potassium: 4.4 mmol/L (ref 3.5–5.3)
Sodium: 136 mmol/L (ref 135–146)
eGFR: 84 mL/min/{1.73_m2} (ref >=60)

## 2023-02-24 LAB — VITAMIN D 25 HYDROXY (VIT D DEFICIENCY, FRACTURES): Vit D, 25-Hydroxy: 47 ng/mL (ref 30–100)

## 2023-02-25 ENCOUNTER — Telehealth: Payer: Self-pay | Admitting: Obstetrics and Gynecology

## 2023-02-25 DIAGNOSIS — M810 Age-related osteoporosis without current pathological fracture: Secondary | ICD-10-CM

## 2023-02-25 NOTE — Telephone Encounter (Signed)
Please precert Prolia for my patient.   She has osteoporosis.  She has been using Fosamax for 5 years and has a worsening T score of her hip and spine while on this therapy.

## 2023-02-26 NOTE — Telephone Encounter (Signed)
Insurance submitted to [employer], will await response for benefits of Prolia.

## 2023-03-03 NOTE — Telephone Encounter (Signed)
Prior Authorization submitted via [employer] Prior Authorization Portal. PA ID: [id number]. Will await response.

## 2023-03-10 NOTE — Telephone Encounter (Signed)
RN checked [employer] portal with PA ID number and portal stated, "No data found." Call to [facility]. Spoke to [employer], Clinical biochemist Rep. Length of phone call- 1 hour and 11 minutes while waiting for update. Marlena Clipper states PA was "aborted" through [place] Portal for issues with the name on customer card. Still working to fix the issue with another representative. Will not be able to submit a new PA until the change is updated. Provided [place] with our office phone number and triage voicemail so she can update when issue is fixed and PA is able to be submitted.

## 2023-05-16 NOTE — Telephone Encounter (Signed)
Requesting update of precert for Prolia.

## 2023-05-18 NOTE — Telephone Encounter (Signed)
No response from [place].  Attempted to re-submit PA through [email or web address] with [employer] on file, response received " Patient inactive".   Call placed to patient, left detailed message, requesting return call to [place], RN at [facility] [phone], option 4. Still unable to submit PA, has [employer] sent new card? Have you reached out to [employer] to correct issue? Return call with update.

## 2023-05-28 NOTE — Telephone Encounter (Signed)
Routing to [employer].

## 2023-06-18 NOTE — Telephone Encounter (Signed)
New insurance information submitted to [employer] portal. Will await summary of benefits for prolia.

## 2023-06-30 NOTE — Telephone Encounter (Signed)
Patient returned call. Undecided if she would like to proceed with Prolia at this time. Has appointment with Dr. Edward Jolly on 07-29-23 and would like to discuss at appointment before proceeding.

## 2023-06-30 NOTE — Telephone Encounter (Signed)
Message left to return call to [place] at [phone].

## 2023-06-30 NOTE — Telephone Encounter (Signed)
Deductible:  none  OOP MAX: none   Annual exam: 07-08-22 BS  Calcium: 10.2           Date: 02-23-23  Upcoming dental procedures: No   Hx of Kidney Disease: No   Last Bone Density Scan: 02-02-23   Is Prior Authorization needed: no per [facility] portal, reference # [id number]  Pt estimated Cost: $150   Coverage Details: For the primary Specialty Pharmacy option, Prolia is subject to a $75 copay. Administration is subject to a separate $75 copay. Copays does contribute to a $6500 OOP max. ($30 met). No deductible or coinsurance applies.

## 2023-07-01 ENCOUNTER — Other Ambulatory Visit: Payer: Self-pay | Admitting: Obstetrics and Gynecology

## 2023-07-01 NOTE — Telephone Encounter (Signed)
Medication refill request: fosamax 70mg   Last AEX:  07/08/22 Next AEX: 07/29/23 Last MMG (if hormonal medication request): 08/06/22 Bi-rads 1 neg  Refill authorized: Dexa done 02/02/23  osteoporotic

## 2023-07-02 MED ORDER — DENOSUMAB 60 MG/ML ~~LOC~~ SOSY: 60.0000 mg | PREFILLED_SYRINGE | Freq: Once | SUBCUTANEOUS | Status: DC

## 2023-07-02 NOTE — Telephone Encounter (Signed)
Order placed for prolia and summary of benefits scanned into Epic should patient desire to proceed with prolia at visit on 07-29-23.   Encounter closed.

## 2023-07-15 NOTE — Progress Notes (Signed)
62 y.o. [id number] Married Caucasian female here for annual exam.  Wants to hold off on the prolia injection for osteoporosis.   She is still taking Fosamax.   No pain with intercourse.   11 grandchildren.   PCP:  Dr. Shella Spearing  Patient's last menstrual period was 10/13/2000 (within years).           Sexually active: Yes.    The current method of family planning is status post hysterectomy.    Exercising: Yes.     Walking, yoga Smoker:  no  Health Maintenance: Pap:  07/08/22 - Normal, neg HR HPV, 05-09-19 Neg:Neg HR HPV, 02/26/18 Neg:Neg HR HPV, 08-21-14 Neg:Neg HR HPV  History of abnormal Pap:  yes, 2005 hx LEEP procedure--CIN II.  Pap 2010 ASCUS and positive HR HPV.  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 Pap 2011 ASCUS and negative HR HPV.  Pap 2012 ASCUS and negative HR HPV.  Colposcopy 2011 VAIN I.  MMG:  08/06/22 Breast Density Cat B, BI-RADS CAT 1 neg Colonoscopy:  2020 normal - 40102 BMD:   02/02/23 -  Result  osteoporosis TDaP:  PCP Gardasil:   no HIV: n/a Hep C: n/a Screening Labs:  PCP   reports that she has never smoked. She has never used smokeless tobacco. She reports current alcohol use of about 7.0 standard drinks of alcohol per week. She reports that she does not use drugs.  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 ectocervix margin   Elevated LDL cholesterol level    Hypertension    Osteoporosis 2019   STD (sexually transmitted disease)    HPV   VAIN I (vaginal intraepithelial neoplasia grade I) 2011    Past Surgical History:  Procedure Laterality Date   ABDOMINAL HYSTERECTOMY  05-23-08   TVH, cystocele repair w/graft & cystoscopy.  Ovaries remain   APPENDECTOMY  07-15-08   CERVICAL BIOPSY  W/ LOOP ELECTRODE EXCISION  08-02-2004   CIN II, Pos. ectocervix margin   VAIN I      Current Outpatient Medications  Medication Sig Dispense Refill   alendronate (FOSAMAX) 70 MG tablet TAKE ONE TABLET BY MOUTH WEEKLY, WITH  A FULL GLASS OF WATER ON AN EMPTY STOMACH. 12 tablet 0   amLODipine (NORVASC) 5 MG tablet Take 5 mg by mouth daily.     atorvastatin (LIPITOR) 20 MG tablet Take 20 mg by mouth every evening.     Calcium Carb-Cholecalciferol (CALCIUM 500 +D) 500-10 MG-MCG TABS Take by mouth.     Cholecalciferol (VITAMIN D3) 125 MCG (5000 UT) TABS Take 1 tablet by mouth as needed.     No current facility-administered medications for this visit.    Family History  Problem Relation Age of Onset   Hypertension Mother    Stroke Father    Hypertension Father    Diabetes Brother    Diabetes Brother    Breast cancer Neg Hx     Review of Systems  All other systems reviewed and are negative.   Exam:   BP 120/80 (BP Location: Left Arm, Patient Position: Sitting, Cuff Size: Normal)   Pulse 69   Ht 5\' 5"  (1.651 m)   Wt 133 lb (60.3 kg)   LMP 10/13/2000 (Within Years)   SpO2 100%   BMI 22.13 kg/m     General appearance: alert, cooperative and appears stated age Head: normocephalic, without obvious abnormality, atraumatic Neck: no adenopathy, supple, symmetrical, trachea midline and thyroid normal to  inspection and palpation Lungs: clear to auscultation bilaterally Breasts: normal appearance, no masses or tenderness, No nipple retraction or dimpling, No nipple discharge or bleeding, No axillary adenopathy Heart: regular rate and rhythm Abdomen: soft, non-tender; no masses, no organomegaly Extremities: extremities normal, atraumatic, no cyanosis or edema Skin: skin color, texture, turgor normal. No rashes or lesions Lymph nodes: cervical, supraclavicular, and axillary nodes normal. Neurologic: grossly normal  Pelvic: External genitalia:  no lesions              No abnormal inguinal nodes palpated.              Urethra:  normal appearing urethra with no masses, tenderness or lesions              Bartholins and Skenes: normal                 Vagina: normal appearing vagina with normal color and  discharge, no lesions.  Mild atrophy noted.               Cervix:  absent              Pap taken: no Bimanual Exam:  Uterus:  absent              Adnexa: no mass, fullness, tenderness              Rectal exam: yes.  Confirms.              Anus:  normal sphincter tone, no lesions  Chaperone was present for exam:  Warren Lacy, CMA  Assessment:   Well woman visit with gynecologic exam. Hx CIN II and VAIN I. Status post LEEP, 2005 Status post hysterectomy.  Benign cervix in 2009.  Ovaries remain. Mild atrophy.  Osteoporosis. On Fosamax since 2019.   Plan: Mammogram screening discussed. Self breast awareness reviewed. Pap and HR HPV 2026. Guidelines for Calcium, Vitamin D, regular exercise program including cardiovascular and weight bearing exercise. Refill of Fosamax 70 mg weekly.   #12, RF 3.   BMD in 2026.  Labs with PCP.  Follow up annually and prn.   After visit summary provided.

## 2023-07-29 ENCOUNTER — Encounter: Payer: Self-pay | Admitting: Obstetrics and Gynecology

## 2023-07-29 ENCOUNTER — Ambulatory Visit (INDEPENDENT_AMBULATORY_CARE_PROVIDER_SITE_OTHER): Payer: 59 | Admitting: Obstetrics and Gynecology

## 2023-07-29 ENCOUNTER — Telehealth: Payer: Self-pay | Admitting: Obstetrics and Gynecology

## 2023-07-29 VITALS — BP 120/80 | HR 69 | Ht 65.0 in | Wt 133.0 lb

## 2023-07-29 DIAGNOSIS — Z01419 Encounter for gynecological examination (general) (routine) without abnormal findings: Secondary | ICD-10-CM | POA: Diagnosis not present

## 2023-07-29 MED ORDER — ALENDRONATE SODIUM 70 MG PO TABS: ORAL_TABLET | ORAL | 3 refills | Status: DC

## 2023-07-29 NOTE — Telephone Encounter (Signed)
Patient was seen today for her annual exam and has declined Prolia for now.    She is undergoing some insurance changes.   I discontinued this on her medication list.

## 2023-07-29 NOTE — Telephone Encounter (Signed)
Routing to [employer].   Encounter closed.

## 2023-07-29 NOTE — Patient Instructions (Signed)

## 2023-11-10 ENCOUNTER — Other Ambulatory Visit: Payer: Self-pay | Admitting: Obstetrics and Gynecology

## 2023-11-10 DIAGNOSIS — Z1231 Encounter for screening mammogram for malignant neoplasm of breast: Secondary | ICD-10-CM

## 2023-11-25 ENCOUNTER — Ambulatory Visit
Admission: RE | Admit: 2023-11-25 | Discharge: 2023-11-25 | Disposition: A | Discharge status: Home / Self Care | Payer: 59 | Source: Ambulatory Visit | Attending: Obstetrics and Gynecology | Admitting: Obstetrics and Gynecology

## 2023-11-25 DIAGNOSIS — Z1231 Encounter for screening mammogram for malignant neoplasm of breast: Secondary | ICD-10-CM

## 2023-11-28 ENCOUNTER — Encounter: Payer: Self-pay | Admitting: Obstetrics and Gynecology

## 2024-08-01 ENCOUNTER — Other Ambulatory Visit: Payer: Self-pay | Admitting: Obstetrics and Gynecology

## 2024-08-02 ENCOUNTER — Other Ambulatory Visit: Payer: Self-pay

## 2024-08-02 NOTE — Telephone Encounter (Signed)
 Med refill request: fosamax   Last AEX: 07/29/23 Next AEX:11/28/24 Last MMG (if hormonal med) Refill authorized: Patient left message on refill line stating she needs a refill for her fosamax . Please advise. Last rx 07/29/23 #12 with 3 refills.

## 2024-08-03 MED ORDER — ALENDRONATE SODIUM 70 MG PO TABS: ORAL_TABLET | ORAL | 0 refills | Status: DC

## 2024-08-05 DIAGNOSIS — Z1331 Encounter for screening for depression: Secondary | ICD-10-CM | POA: Diagnosis not present

## 2024-08-05 DIAGNOSIS — Z1159 Encounter for screening for other viral diseases: Secondary | ICD-10-CM | POA: Diagnosis not present

## 2024-08-05 DIAGNOSIS — Z23 Encounter for immunization: Secondary | ICD-10-CM | POA: Diagnosis not present

## 2024-08-05 DIAGNOSIS — E7800 Pure hypercholesterolemia, unspecified: Secondary | ICD-10-CM | POA: Diagnosis not present

## 2024-08-05 DIAGNOSIS — R011 Cardiac murmur, unspecified: Secondary | ICD-10-CM | POA: Diagnosis not present

## 2024-08-05 DIAGNOSIS — I10 Essential (primary) hypertension: Secondary | ICD-10-CM | POA: Diagnosis not present

## 2024-08-05 DIAGNOSIS — Z0000 Encounter for general adult medical examination without abnormal findings: Secondary | ICD-10-CM | POA: Diagnosis not present

## 2024-08-05 DIAGNOSIS — Z79899 Other long term (current) drug therapy: Secondary | ICD-10-CM | POA: Diagnosis not present

## 2024-08-05 DIAGNOSIS — Z131 Encounter for screening for diabetes mellitus: Secondary | ICD-10-CM | POA: Diagnosis not present

## 2024-09-02 DIAGNOSIS — R011 Cardiac murmur, unspecified: Secondary | ICD-10-CM | POA: Diagnosis not present

## 2024-10-14 ENCOUNTER — Other Ambulatory Visit: Payer: Self-pay | Admitting: Obstetrics and Gynecology

## 2024-10-14 DIAGNOSIS — Z1231 Encounter for screening mammogram for malignant neoplasm of breast: Secondary | ICD-10-CM

## 2024-10-20 ENCOUNTER — Other Ambulatory Visit: Payer: Self-pay | Admitting: Obstetrics and Gynecology

## 2024-10-20 NOTE — Telephone Encounter (Signed)
 Med refill request: ALENDRONTE (FOSAMAX ) 70 MG Last AEX: 07/29/2023 BS Next AEX: 11/28/2024 BS Last MMG (if hormonal med) 11/25/2023 Refill authorized: Please Advise? Last Rx sent #12 with zero refills on 08/03/2024 [facility]

## 2024-11-28 ENCOUNTER — Encounter: Admitting: Obstetrics and Gynecology

## 2024-11-29 ENCOUNTER — Ambulatory Visit
